# Patient Record
Sex: Male | Born: 1970 | Race: Black or African American | Hispanic: No | Marital: Married | Smoking: Former smoker
Health system: Southern US, Community
[De-identification: ages and names within clinical notes are randomized; demographics above are authoritative.]

---

## 2002-09-23 ENCOUNTER — Emergency Department (HOSPITAL_COMMUNITY): Admission: EM | Admit: 2002-09-23 | Discharge: 2002-09-23 | Payer: Self-pay | Admitting: *Deleted

## 2002-09-23 ENCOUNTER — Encounter: Payer: Self-pay | Admitting: *Deleted

## 2004-01-05 ENCOUNTER — Emergency Department (HOSPITAL_COMMUNITY): Admission: EM | Admit: 2004-01-05 | Discharge: 2004-01-05 | Payer: Self-pay | Admitting: *Deleted

## 2004-02-07 ENCOUNTER — Emergency Department (HOSPITAL_COMMUNITY): Admission: EM | Admit: 2004-02-07 | Discharge: 2004-02-08 | Payer: Self-pay | Admitting: *Deleted

## 2005-10-12 ENCOUNTER — Emergency Department (HOSPITAL_COMMUNITY): Admission: EM | Admit: 2005-10-12 | Discharge: 2005-10-12 | Payer: Self-pay | Admitting: Emergency Medicine

## 2006-09-26 ENCOUNTER — Emergency Department (HOSPITAL_COMMUNITY): Admission: EM | Admit: 2006-09-26 | Discharge: 2006-09-26 | Payer: Self-pay | Admitting: Emergency Medicine

## 2010-07-21 ENCOUNTER — Emergency Department (HOSPITAL_COMMUNITY)
Admission: EM | Admit: 2010-07-21 | Discharge: 2010-07-21 | Disposition: A | Discharge status: Home / Self Care | Payer: Self-pay | Attending: Emergency Medicine | Admitting: Emergency Medicine

## 2010-07-21 DIAGNOSIS — R220 Localized swelling, mass and lump, head: Secondary | ICD-10-CM | POA: Insufficient documentation

## 2010-07-21 DIAGNOSIS — K047 Periapical abscess without sinus: Secondary | ICD-10-CM | POA: Insufficient documentation

## 2010-07-21 DIAGNOSIS — R221 Localized swelling, mass and lump, neck: Secondary | ICD-10-CM | POA: Insufficient documentation

## 2010-07-21 DIAGNOSIS — K089 Disorder of teeth and supporting structures, unspecified: Secondary | ICD-10-CM | POA: Insufficient documentation

## 2010-11-21 ENCOUNTER — Other Ambulatory Visit (HOSPITAL_COMMUNITY): Payer: Self-pay | Admitting: Physical Medicine and Rehabilitation

## 2010-11-21 DIAGNOSIS — M5137 Other intervertebral disc degeneration, lumbosacral region: Secondary | ICD-10-CM

## 2010-11-21 DIAGNOSIS — M545 Low back pain: Secondary | ICD-10-CM

## 2010-11-21 DIAGNOSIS — M5126 Other intervertebral disc displacement, lumbar region: Secondary | ICD-10-CM

## 2010-11-23 ENCOUNTER — Ambulatory Visit (HOSPITAL_COMMUNITY)
Admission: RE | Admit: 2010-11-23 | Discharge: 2010-11-23 | Disposition: A | Discharge status: Home / Self Care | Payer: BC Managed Care – PPO | Source: Ambulatory Visit | Attending: Physical Medicine and Rehabilitation | Admitting: Physical Medicine and Rehabilitation

## 2010-11-23 DIAGNOSIS — M51379 Other intervertebral disc degeneration, lumbosacral region without mention of lumbar back pain or lower extremity pain: Secondary | ICD-10-CM | POA: Insufficient documentation

## 2010-11-23 DIAGNOSIS — M545 Low back pain, unspecified: Secondary | ICD-10-CM | POA: Insufficient documentation

## 2010-11-23 DIAGNOSIS — M5137 Other intervertebral disc degeneration, lumbosacral region: Secondary | ICD-10-CM

## 2010-11-23 DIAGNOSIS — X500XXA Overexertion from strenuous movement or load, initial encounter: Secondary | ICD-10-CM | POA: Insufficient documentation

## 2010-11-23 DIAGNOSIS — IMO0002 Reserved for concepts with insufficient information to code with codable children: Secondary | ICD-10-CM | POA: Insufficient documentation

## 2010-11-23 DIAGNOSIS — M5126 Other intervertebral disc displacement, lumbar region: Secondary | ICD-10-CM

## 2010-12-05 ENCOUNTER — Emergency Department (HOSPITAL_COMMUNITY)
Admission: EM | Admit: 2010-12-05 | Discharge: 2010-12-05 | Disposition: A | Discharge status: Home / Self Care | Payer: BC Managed Care – PPO | Attending: Emergency Medicine | Admitting: Emergency Medicine

## 2010-12-05 ENCOUNTER — Encounter: Payer: Self-pay | Admitting: *Deleted

## 2010-12-05 ENCOUNTER — Emergency Department (HOSPITAL_COMMUNITY): Payer: BC Managed Care – PPO

## 2010-12-05 DIAGNOSIS — M778 Other enthesopathies, not elsewhere classified: Secondary | ICD-10-CM

## 2010-12-05 DIAGNOSIS — M65849 Other synovitis and tenosynovitis, unspecified hand: Secondary | ICD-10-CM | POA: Insufficient documentation

## 2010-12-05 DIAGNOSIS — M65839 Other synovitis and tenosynovitis, unspecified forearm: Secondary | ICD-10-CM | POA: Insufficient documentation

## 2010-12-05 MED ORDER — DICLOFENAC SODIUM 75 MG PO TBEC: 75.0000 mg | DELAYED_RELEASE_TABLET | Freq: Two times a day (BID) | ORAL | Status: AC

## 2010-12-05 NOTE — ED Notes (Signed)
C/o to pain to left arm/wrist x 2-3 wks.  Denies injury.  Increased pain with movement of thumb.

## 2010-12-05 NOTE — ED Provider Notes (Signed)
History     CSN: [id number] Arrival date & time: 12/05/2010  1:23 PM  Chief Complaint  Patient presents with  . Arm Pain   Patient is a 40 y.o. male presenting with wrist pain. The history is provided by the patient.  Wrist Pain The current episode started 1 to 4 weeks ago. The problem occurs constantly. The problem has been gradually worsening. Associated symptoms include arthralgias. Pertinent negatives include no abdominal pain, chest pain, fever, headaches, joint swelling, myalgias, nausea, neck pain, numbness, rash, swollen glands, vomiting or weakness. The symptoms are aggravated by twisting (palpation, movement). He has tried NSAIDs for the symptoms. The treatment provided no relief.    History reviewed. No pertinent past medical history.  History reviewed. No pertinent past surgical history.  No family history on file.  History  Substance Use Topics  . Smoking status: Never Smoker   . Smokeless tobacco: Not on file  . Alcohol Use: No      Review of Systems  Constitutional: Negative for fever.  HENT: Negative for neck pain.   Cardiovascular: Negative for chest pain.  Gastrointestinal: Negative for nausea, vomiting and abdominal pain.  Musculoskeletal: Positive for arthralgias. Negative for myalgias, back pain and joint swelling.  Skin: Negative for rash.  Neurological: Negative for weakness, numbness and headaches.  All other systems reviewed and are negative.    Physical Exam  BP 135/77  Pulse 69  Temp(Src) 98 F (36.7 C) (Oral)  Resp 14  Ht 6\' 2"  (1.88 m)  Wt 195 lb (88.451 kg)  BMI 25.04 kg/m2  SpO2 100%  Physical Exam  Nursing note and vitals reviewed. Constitutional: He is oriented to person, place, and time. He appears well-developed and well-nourished. No distress.  HENT:  Head: Normocephalic and atraumatic.  Mouth/Throat: Oropharynx is clear and moist.  Neck: Normal range of motion. Neck supple. No thyromegaly present.  Cardiovascular: Normal  rate, regular rhythm and normal heart sounds.   Pulmonary/Chest: Effort normal and breath sounds normal.  Musculoskeletal: He exhibits tenderness. He exhibits no edema.       Left wrist: He exhibits decreased range of motion and tenderness. He exhibits no swelling, no effusion, no crepitus, no deformity and no laceration.  Lymphadenopathy:    He has no cervical adenopathy.  Neurological: He is alert and oriented to person, place, and time. He exhibits normal muscle tone. Coordination normal.  Skin: Skin is warm and dry.    ED Course  ORTHOPEDIC INJURY TREATMENT Date/Time: 12/05/2010 3:35 PM Performed by: Trisha Mangle, Adib Wahba L. Authorized by: Benny Lennert Consent: Verbal consent obtained. Written consent not obtained. Consent given by: patient Patient understanding: patient states understanding of the procedure being performed Patient consent: the patient's understanding of the procedure matches consent given Procedure consent: procedure consent matches procedure scheduled Imaging studies: imaging studies available Patient identity confirmed: verbally with patient Time out: Immediately prior to procedure a "time out" was called to verify the correct patient, procedure, equipment, support staff and site/side marked as required. Injury location: wrist Location details: left wrist Injury type: soft tissue Pre-procedure neurovascular assessment: neurovascularly intact Pre-procedure distal perfusion: normal Pre-procedure neurological function: normal Pre-procedure range of motion: normal Local anesthesia used: no Patient sedated: no Immobilization: brace Splint type: velcro wrist splint. Post-procedure neurovascular assessment: post-procedure neurovascularly intact Post-procedure distal perfusion: normal Post-procedure neurological function: normal Post-procedure range of motion: normal Patient tolerance: Patient tolerated the procedure well with no immediate  complications.    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  Will apply velcro wrist splint and pt agrees to f/u with ortho   Dg Wrist Complete Left  12/05/2010  *RADIOLOGY REPORT*  Clinical Data: Pain in the left thumb and posterior left wrist  LEFT WRIST - COMPLETE 3+ VIEW  Comparison: None.  Findings: Four view exam of the left wrist shows no evidence for an acute fracture.  Carpal alignment is anatomic.  No worrisome lytic or sclerotic osseous abnormality.  IMPRESSION: Normal exam.  Original Report Authenticated By: ERIC A. MANSELL, M.D.   Patient / Family / Caregiver understand and agree with initial ED impression and plan with expectations set for ED visit.    OUTPATIENT MEDICATIONS PRESCRIBED FROM THE ED:  Patient's Medications  New Prescriptions   DICLOFENAC (VOLTAREN) 75 MG EC TABLET    Take 1 tablet (75 mg total) by mouth 2 (two) times daily. Take with food  Previous Medications   No medications on file  Modified Medications   No medications on file  Discontinued Medications   IBUPROFEN (ADVIL,MOTRIN) 200 MG TABLET    Take 200 mg by mouth every 6 (six) hours as needed. For pain      The patient appears reasonably screened and/or stabilized for discharge and I doubt any other medical condition or other [facility] requiring further screening, evaluation, or treatment in the ED at this time prior to discharge.   Goran Olden L. Amr Sturte[place], [place] 12/12/10 1311

## 2010-12-11 ENCOUNTER — Ambulatory Visit: Payer: BC Managed Care – PPO | Admitting: Orthopedic Surgery

## 2010-12-11 ENCOUNTER — Encounter: Payer: Self-pay | Admitting: Orthopedic Surgery

## 2010-12-13 ENCOUNTER — Ambulatory Visit (INDEPENDENT_AMBULATORY_CARE_PROVIDER_SITE_OTHER): Payer: BC Managed Care – PPO | Admitting: Orthopedic Surgery

## 2010-12-13 ENCOUNTER — Encounter: Payer: Self-pay | Admitting: Orthopedic Surgery

## 2010-12-13 VITALS — HR 58 | Ht 74.5 in | Wt 196.0 lb

## 2010-12-13 DIAGNOSIS — M654 Radial styloid tenosynovitis [de Quervain]: Secondary | ICD-10-CM

## 2010-12-13 MED ORDER — DICLOFENAC SODIUM 75 MG PO TBEC: 75.0000 mg | DELAYED_RELEASE_TABLET | Freq: Two times a day (BID) | ORAL | Status: AC

## 2010-12-13 NOTE — Progress Notes (Signed)
Patient referred from the emergency room  The patient complains of pain in his LEFT wrist over his LEFT thumb since August of this year.  The pain started gradually and is associated with a sharp burning sent pain over the thumb associated with ulnar deviation which seems to come and go activity related.  Intensity of pain 7/10.  Review of systems reveals no other issues all 14 systems were reviewed  History reviewed. No pertinent past medical history.  History reviewed. No pertinent past surgical history.  Vital signs height is 6 feet 2-1/2 inches weight is 196 pounds pulse is 58. Gen. Appearance the patient is tall lean muscular  He is oriented x3 his mood and affect are normal  He ambulates normally  Examination of the LEFT wrist he is tender over the first extensor compartment there is swelling.  He has pain with ulnar deviation wrist joint is stable strength is normal skin is intact pulses good temperature is normal sensation is normal.  No pathologic reflexes balance was excellent  X-rays from the hospital with the report indicated no fracture dislocation or bony abnormality  ER record indicates possible de Quervain's syndrome  Diagnosis de Quervain's syndrome  Recommend anti-inflammatories I refilled his Voltaren 75 mg twice a day.  Wear brace 6 weeks ice 30 minutes a day.  Call if no improvement after 6 weeks.

## 2010-12-13 NOTE — Patient Instructions (Signed)
DEQUERVAINS SYNDROME   THUMB TENDONITIS  Wear brace and take medication for 6 weeks

## 2010-12-14 NOTE — ED Provider Notes (Signed)
Medical screening examination/treatment/procedure(s) were performed by non-physician practitioner and as supervising physician I was immediately available for consultation/collaboration.   Benny Lennert, MD 12/14/10 [phone]

## 2011-01-31 LAB — URINALYSIS, ROUTINE W REFLEX MICROSCOPIC
Bilirubin Urine: NEGATIVE
Hgb urine dipstick: NEGATIVE
Nitrite: NEGATIVE
Specific Gravity, Urine: >1.03 — ABNORMAL HIGH
Urobilinogen, UA: 0.2
pH: 6

## 2011-07-02 ENCOUNTER — Emergency Department (HOSPITAL_COMMUNITY)
Admission: EM | Admit: 2011-07-02 | Discharge: 2011-07-02 | Disposition: A | Discharge status: Home / Self Care | Payer: BC Managed Care – PPO | Attending: Emergency Medicine | Admitting: Emergency Medicine

## 2011-07-02 ENCOUNTER — Encounter (HOSPITAL_COMMUNITY): Payer: Self-pay | Admitting: *Deleted

## 2011-07-02 DIAGNOSIS — S39012A Strain of muscle, fascia and tendon of lower back, initial encounter: Secondary | ICD-10-CM

## 2011-07-02 DIAGNOSIS — S335XXA Sprain of ligaments of lumbar spine, initial encounter: Secondary | ICD-10-CM | POA: Insufficient documentation

## 2011-07-02 DIAGNOSIS — X503XXA Overexertion from repetitive movements, initial encounter: Secondary | ICD-10-CM | POA: Insufficient documentation

## 2011-07-02 MED ORDER — DICLOFENAC SODIUM 75 MG PO TBEC: 75.0000 mg | DELAYED_RELEASE_TABLET | Freq: Two times a day (BID) | ORAL | Status: AC

## 2011-07-02 MED ORDER — METHOCARBAMOL 500 MG PO TABS: ORAL_TABLET | ORAL | Status: DC

## 2011-07-02 MED ORDER — HYDROCODONE-ACETAMINOPHEN 7.5-325 MG PO TABS: 1.0000 | ORAL_TABLET | ORAL | Status: AC | PRN

## 2011-07-02 NOTE — ED Provider Notes (Signed)
History     CSN: [id number]  Arrival date & time 07/02/11  1658   First MD Initiated Contact with Patient 07/02/11 1756      Chief Complaint  Patient presents with  . Back Pain    (Consider location/radiation/quality/duration/timing/severity/associated sxs/prior treatment) Patient is a 41 y.o. male presenting with back pain. The history is provided by the patient.  Back Pain  This is a new problem. The current episode started more than 2 days ago. The problem occurs daily. The problem has not changed since onset.The pain is associated with lifting heavy objects. The pain is present in the lumbar spine. The quality of the pain is described as shooting and aching. The pain is moderate. The symptoms are aggravated by bending, twisting and certain positions. The pain is the same all the time. Pertinent negatives include no chest pain, no abdominal pain, no bowel incontinence, no perianal numbness, no bladder incontinence, no dysuria and no paresthesias. He has tried NSAIDs for the symptoms. The treatment provided no relief.    History reviewed. No pertinent past medical history.  History reviewed. No pertinent past surgical history.  Family History  Problem Relation Age of Onset  . Heart disease    . Diabetes      History  Substance Use Topics  . Smoking status: Never Smoker   . Smokeless tobacco: Not on file  . Alcohol Use: No      Review of Systems  Constitutional: Negative for activity change.       All ROS Neg except as noted in HPI  HENT: Negative for nosebleeds and neck pain.   Eyes: Negative for photophobia and discharge.  Respiratory: Negative for cough, shortness of breath and wheezing.   Cardiovascular: Negative for chest pain and palpitations.  Gastrointestinal: Negative for abdominal pain, blood in stool and bowel incontinence.  Genitourinary: Negative for bladder incontinence, dysuria, frequency and hematuria.  Musculoskeletal: Positive for back pain. Negative  for arthralgias.  Skin: Negative.   Neurological: Negative for dizziness, seizures, speech difficulty and paresthesias.  Psychiatric/Behavioral: Negative for hallucinations and confusion.    Allergies  Review of patient's allergies indicates no known allergies.  Home Medications   Current Outpatient Rx  Name Route Sig Dispense Refill  . DICLOFENAC SODIUM 75 MG PO TBEC Oral Take 1 tablet (75 mg total) by mouth 2 (two) times daily. Take with food 20 tablet 0  . DICLOFENAC SODIUM 75 MG PO TBEC Oral Take 1 tablet (75 mg total) by mouth 2 (two) times daily. 40 tablet 1  . DICLOFENAC SODIUM 75 MG PO TBEC Oral Take 1 tablet (75 mg total) by mouth 2 (two) times daily. 14 tablet 0  . HYDROCODONE-ACETAMINOPHEN 7.5-325 MG PO TABS Oral Take 1 tablet by mouth every 4 (four) hours as needed for pain. 20 tablet 0  . METHOCARBAMOL 500 MG PO TABS  2 po tid for spasm 30 tablet 0    BP 128/78  Pulse 66  Temp(Src) 97.8 F (36.6 C) (Oral)  Resp 20  Ht 6\' 2"  (1.88 m)  Wt 204 lb (92.534 kg)  BMI 26.19 kg/m2  SpO2 99%  Physical Exam  Nursing note and vitals reviewed. Constitutional: He is oriented to person, place, and time. He appears well-developed and well-nourished.  Non-toxic appearance.  HENT:  Head: Normocephalic.  Right Ear: Tympanic membrane and external ear normal.  Left Ear: Tympanic membrane and external ear normal.  Eyes: EOM and lids are normal. Pupils are equal, round, and reactive to light.  Neck: Normal range of motion. Neck supple. Carotid bruit is not present.  Cardiovascular: Normal rate, regular rhythm, normal heart sounds, intact distal pulses and normal pulses.   Pulmonary/Chest: Breath sounds normal. No respiratory distress.  Abdominal: Soft. Bowel sounds are normal. There is no tenderness. There is no guarding.  Musculoskeletal: Normal range of motion.       There is pain in palpable spasm in the lower lumbar area. There is decreased range of motion due to to pain.    Lymphadenopathy:       Head (right side): No submandibular adenopathy present.       Head (left side): No submandibular adenopathy present.    He has no cervical adenopathy.  Neurological: He is alert and oriented to person, place, and time. He has normal strength. No cranial nerve deficit or sensory deficit. He exhibits normal muscle tone. Coordination normal.  Skin: Skin is warm and dry.  Psychiatric: He has a normal mood and affect. His speech is normal.    ED Course  Procedures (including critical care time) Pulse oximetry 99% on room air. Within normal limits by my interpretation. Labs Reviewed - No data to display No results found.   1. Lumbar strain       MDM  I have reviewed nursing notes, vital signs, and all appropriate lab and imaging results for this patient. Patient states he does a lot of heavy lifting on his job and he has been having problems with his lower back over the last 3-4 days. The patient also states that he was diagnosed with a" bulging disc" approximately a year ago. This began to get better with physical therapy. No gross neurologic deficits appreciated on examination today. Patient is prescribed all care and 75 mg twice daily with food, hydrocodone 7.5 mg every 4 hours as needed, and Robaxin 3 times daily for spasm. Patient is to see the spine specialist if not improving.       Kathie Dike, [place] 07/02/11 [phone]

## 2011-07-02 NOTE — ED Notes (Signed)
Low back pain,  No known injury

## 2011-07-02 NOTE — ED Notes (Signed)
Pt states has a bulging disc in lower back; diagnosis in September 2012. Pt states has a flare up for a couple of days now. Pain does not radiate into legs.

## 2011-07-02 NOTE — Discharge Instructions (Signed)
Please alternate heat and ice to your lower back. Please use dalteparin 2 times daily with food until all taken. Please use Robaxin for spasms if needed, please use Norco for pain if needed. Robaxin Norco may cause drowsiness, please use with caution. If not improving please see the spine specialists listed above for additional evaluation and treatment.Back Pain, Adult Back pain is very common. The pain often gets better over time. The cause of back pain is usually not dangerous. Most people can learn to manage their back pain on their own.  HOME CARE   Stay active. Start with short walks on flat ground if you can. Try to walk farther each day.   Do not sit, drive, or stand in one place for more than 30 minutes. Do not stay in bed.   Do not avoid exercise or work. Activity can help your back heal faster.   Be careful when you bend or lift an object. Bend at your knees, keep the object close to you, and do not twist.   Sleep on a firm mattress. Lie on your side, and bend your knees. If you lie on your back, put a pillow under your knees.   Only take medicines as told by your doctor.   Put ice on the injured area.   Put ice in a plastic bag.   Place a towel between your skin and the bag.   Leave the ice on for 15 to 20 minutes, 3 to 4 times a day for the first 2 to 3 days. After that, you can switch between ice and heat packs.   Ask your doctor about back exercises or massage.   Avoid feeling anxious or stressed. Find good ways to deal with stress, such as exercise.  GET HELP RIGHT AWAY IF:   Your pain does not go away with rest or medicine.   Your pain does not go away in 1 week.   You have new problems.   You do not feel well.   The pain spreads into your legs.   You cannot control when you poop (bowel movement) or pee (urinate).   Your arms or legs feel weak or lose feeling (numbness).   You feel sick to your stomach (nauseous) or throw up (vomit).   You have belly  (abdominal) pain.   You feel like you may pass out (faint).  MAKE SURE YOU:   Understand these instructions.   Will watch your condition.   Will get help right away if you are not doing well or get worse.  Document Released: 09/18/2007 Document Revised: 03/21/2011 Document Reviewed: 08/20/2010 [employer] Patient Information 2012 [place], [place].

## 2011-07-05 NOTE — ED Provider Notes (Signed)
Medical screening examination/treatment/procedure(s) were performed by non-physician practitioner and as supervising physician I was immediately available for consultation/collaboration.  Donnetta Hutching, MD 07/05/11 2255

## 2013-07-19 ENCOUNTER — Emergency Department (HOSPITAL_COMMUNITY): Payer: BC Managed Care – PPO

## 2013-07-19 ENCOUNTER — Encounter (HOSPITAL_COMMUNITY): Payer: Self-pay | Admitting: Emergency Medicine

## 2013-07-19 ENCOUNTER — Emergency Department (HOSPITAL_COMMUNITY)
Admission: EM | Admit: 2013-07-19 | Discharge: 2013-07-19 | Disposition: A | Discharge status: Home / Self Care | Payer: BC Managed Care – PPO | Attending: Emergency Medicine | Admitting: Emergency Medicine

## 2013-07-19 DIAGNOSIS — S99929A Unspecified injury of unspecified foot, initial encounter: Secondary | ICD-10-CM

## 2013-07-19 DIAGNOSIS — Y929 Unspecified place or not applicable: Secondary | ICD-10-CM | POA: Insufficient documentation

## 2013-07-19 DIAGNOSIS — X500XXA Overexertion from strenuous movement or load, initial encounter: Secondary | ICD-10-CM | POA: Insufficient documentation

## 2013-07-19 DIAGNOSIS — E119 Type 2 diabetes mellitus without complications: Secondary | ICD-10-CM | POA: Insufficient documentation

## 2013-07-19 DIAGNOSIS — Y9389 Activity, other specified: Secondary | ICD-10-CM | POA: Insufficient documentation

## 2013-07-19 DIAGNOSIS — S8990XA Unspecified injury of unspecified lower leg, initial encounter: Secondary | ICD-10-CM | POA: Insufficient documentation

## 2013-07-19 DIAGNOSIS — Z8679 Personal history of other diseases of the circulatory system: Secondary | ICD-10-CM | POA: Insufficient documentation

## 2013-07-19 DIAGNOSIS — Z8739 Personal history of other diseases of the musculoskeletal system and connective tissue: Secondary | ICD-10-CM | POA: Insufficient documentation

## 2013-07-19 DIAGNOSIS — S39012A Strain of muscle, fascia and tendon of lower back, initial encounter: Secondary | ICD-10-CM

## 2013-07-19 DIAGNOSIS — S335XXA Sprain of ligaments of lumbar spine, initial encounter: Secondary | ICD-10-CM | POA: Insufficient documentation

## 2013-07-19 DIAGNOSIS — S99919A Unspecified injury of unspecified ankle, initial encounter: Secondary | ICD-10-CM

## 2013-07-19 MED ORDER — OXYCODONE-ACETAMINOPHEN 5-325 MG PO TABS: 1.0000 | ORAL_TABLET | ORAL | Status: DC | PRN

## 2013-07-19 MED ORDER — CYCLOBENZAPRINE HCL 10 MG PO TABS: 10.0000 mg | ORAL_TABLET | Freq: Three times a day (TID) | ORAL | Status: DC | PRN

## 2013-07-19 NOTE — ED Notes (Signed)
Pt arrives with c/o lower back pain, states he was helping his step son move furniture and started having pain on Saturday, pain has gotten worse until today, pt states it was hard to get out of bed this morning. No acute distress, ambulates independently with no assistance. A/O x4

## 2013-07-19 NOTE — ED Provider Notes (Signed)
CSN: [id number]     Arrival date & time 07/19/13  1428 History  This chart was scribed for non-physician practitioner Pauline Ausammy Findley Blankenbaker, PA-C working with Donnetta HutchingBrian Cook, MD by Dorothey Basemania Sutton, ED Scribe. This patient was seen in room APFT23/APFT23 and the patient's care was started at 4:50 PM.    Chief Complaint  Patient presents with  . Back Pain   The history is provided by the patient. No language interpreter was used.   HPI Comments: Sean Steele is a 43 y.o. male who presents to the Emergency Department complaining of a constant pain to the lower back onset 3 days ago after he reports that he had to do some heavy lifting. Patient reports an associated pain to the right, upper leg. He states that the pain has been progressively worsening and is exacerbated with movement. He reports taking ibuprofen at home with temporary relief. Patient reports a history of herniated disc (diagnosed by MRI in 2012) and states that his current symptoms feel similar. He denies history of prior surgeries to the area. He denies weakness, numbness, bowel or bladder incontinence/retention, fever, chills. He reports that he has an appointment with Dr. Janna ArchonDiego in 2 days. Patient has no other pertinent medical history.   History reviewed. No pertinent past medical history. History reviewed. No pertinent past surgical history. Family History  Problem Relation Age of Onset  . Heart disease    . Diabetes     History  Substance Use Topics  . Smoking status: Never Smoker   . Smokeless tobacco: Not on file  . Alcohol Use: No    Review of Systems  Constitutional: Negative for fever and chills.  Respiratory: Negative for shortness of breath.   Gastrointestinal: Negative for vomiting, abdominal pain and constipation.  Genitourinary: Negative for dysuria, hematuria, flank pain, decreased urine volume and difficulty urinating.       No perineal numbness or incontinence of urine or feces  Musculoskeletal: Positive for back  pain and myalgias. Negative for joint swelling.  Skin: Negative for rash.  Neurological: Negative for weakness and numbness.  All other systems reviewed and are negative.  Allergies  Review of patient's allergies indicates no known allergies.  Home Medications   Current Outpatient Rx  Name  Route  Sig  Dispense  Refill  . methocarbamol (ROBAXIN) 500 MG tablet      2 po tid for spasm   30 tablet   0    Triage Vitals: BP 116/75  Pulse 79  Temp(Src) 98 F (36.7 C) (Oral)  Resp 14  Ht 6\' 2"  (1.88 m)  Wt 200 lb (90.719 kg)  BMI 25.67 kg/m2  SpO2 99%  Physical Exam  Nursing note and vitals reviewed. Constitutional: He is oriented to person, place, and time. He appears well-developed and well-nourished. No distress.  HENT:  Head: Normocephalic and atraumatic.  Eyes: Conjunctivae are normal.  Neck: Normal range of motion. Neck supple.  Cardiovascular: Normal rate, regular rhythm, normal heart sounds and intact distal pulses.   No murmur heard. Pulmonary/Chest: Effort normal and breath sounds normal. No respiratory distress.  Abdominal: Soft. He exhibits no distension. There is no tenderness.  Musculoskeletal: Normal range of motion. He exhibits tenderness. He exhibits no edema.       Lumbar back: He exhibits tenderness and pain. He exhibits normal range of motion, no swelling, no deformity, no laceration and normal pulse.  Tenderness to palpation to the bilateral lumbar paraspinal muscles.   Neurological: He is alert and oriented  to person, place, and time. He has normal strength. No sensory deficit. He exhibits normal muscle tone. Coordination and gait normal.  Reflex Scores:      Patellar reflexes are 2+ on the right side and 2+ on the left side.      Achilles reflexes are 2+ on the right side and 2+ on the left side. Normal strength against resistance of bilateral lower extremities.   Skin: Skin is warm and dry. No rash noted.  Psychiatric: He has a normal mood and affect.  His behavior is normal.    ED Course  Procedures (including critical care time)  DIAGNOSTIC STUDIES: Oxygen Saturation is 99% on room air, normal by my interpretation.    COORDINATION OF CARE: 3:08 PM- Ordered an x-ray of the L spine.  4:54 PM- Discussed that x-ray results were negative. Will discharge patient with muscle relaxants and #15 percocet to manage symptoms. Patient will follow up with Dr. Janna Arch in 2 days. Discussed treatment plan with patient at bedside and patient verbalized agreement.     Labs Review Labs Reviewed - No data to display  Imaging Review Dg Lumbar Spine Complete  07/19/2013   CLINICAL DATA:  Low back pain  EXAM: LUMBAR SPINE - COMPLETE 4+ VIEW  COMPARISON:  MR L SPINE W/O dated 11/23/2010  FINDINGS: There are 5 nonrib bearing lumbar-type vertebral bodies. The vertebral body heights are maintained. The alignment is anatomic. There is no spondylolysis. There is no acute fracture or static listhesis. Mild degenerative disc disease at L5-S1.  The SI joints are unremarkable.  IMPRESSION: No acute osseous injury of the lumbar spine.   Electronically Signed   By: Elige Ko   On: 07/19/2013 15:40     EKG Interpretation None      MDM   Final diagnoses:  Lumbar strain    Patient has ttp of the lumbar paraspinal muscles.  No focal neuro deficits on exam.  Ambulates with a steady gait.  No concerning symptoms for emergent neurological or infectious process   I personally performed the services described in this documentation, which was scribed in my presence. The recorded information has been reviewed and is accurate.   Niomie Englert L. Trisha Mangle, PA-C 07/22/13 1643

## 2013-07-19 NOTE — ED Notes (Signed)
Pt with lower back pain after moving furniture and a casket on Saturday, pt able to ambulate to room without difficultly

## 2013-07-19 NOTE — Discharge Instructions (Signed)
Lumbosacral Strain Lumbosacral strain is a strain of any of the parts that make up your lumbosacral vertebrae. Your lumbosacral vertebrae are the bones that make up the lower third of your backbone. Your lumbosacral vertebrae are held together by muscles and tough, fibrous tissue (ligaments).  CAUSES  A sudden blow to your back can cause lumbosacral strain. Also, anything that causes an excessive stretch of the muscles in the low back can cause this strain. This is typically seen when people exert themselves strenuously, fall, lift heavy objects, bend, or crouch repeatedly. RISK FACTORS  Physically demanding work.  Participation in pushing or pulling sports or sports that require sudden twist of the back (tennis, golf, baseball).  Weight lifting.  Excessive lower back curvature.  Forward-tilted pelvis.  Weak back or abdominal muscles or both.  Tight hamstrings. SIGNS AND SYMPTOMS  Lumbosacral strain may cause pain in the area of your injury or pain that moves (radiates) down your leg.  DIAGNOSIS Your health care provider can often diagnose lumbosacral strain through a physical exam. In some cases, you may need tests such as X-ray exams.  TREATMENT  Treatment for your lower back injury depends on many factors that your clinician will have to evaluate. However, most treatment will include the use of anti-inflammatory medicines. HOME CARE INSTRUCTIONS   Avoid hard physical activities (tennis, racquetball, waterskiing) if you are not in proper physical condition for it. This may aggravate or create problems.  If you have a back problem, avoid sports requiring sudden body movements. Swimming and walking are generally safer activities.  Maintain good posture.  Maintain a healthy weight.  For acute conditions, you may put ice on the injured area.  Put ice in a plastic bag.  Place a towel between your skin and the bag.  Leave the ice on for 20 minutes, 2 3 times a day.  When the  low back starts healing, stretching and strengthening exercises may be recommended. SEEK MEDICAL CARE IF:  Your back pain is getting worse.  You experience severe back pain not relieved with medicines. SEEK IMMEDIATE MEDICAL CARE IF:   You have numbness, tingling, weakness, or problems with the use of your arms or legs.  There is a change in bowel or bladder control.  You have increasing pain in any area of the body, including your belly (abdomen).  You notice shortness of breath, dizziness, or feel faint.  You feel sick to your stomach (nauseous), are throwing up (vomiting), or become sweaty.  You notice discoloration of your toes or legs, or your feet get very cold. MAKE SURE YOU:   Understand these instructions.  Will watch your condition.  Will get help right away if you are not doing well or get worse. Document Released: 01/09/2005 Document Revised: 01/20/2013 Document Reviewed: 11/18/2012 [employer] Patient Information 2014 [employer].  

## 2013-07-23 NOTE — ED Provider Notes (Signed)
Medical screening examination/treatment/procedure(s) were performed by non-physician practitioner and as supervising physician I was immediately available for consultation/collaboration.   EKG Interpretation None       Donnetta HutchingBrian Mariya Mottley, MD 07/23/13 1954

## 2014-08-30 ENCOUNTER — Encounter (HOSPITAL_COMMUNITY): Payer: Self-pay | Admitting: Emergency Medicine

## 2014-08-30 ENCOUNTER — Emergency Department (HOSPITAL_COMMUNITY)
Admission: EM | Admit: 2014-08-30 | Discharge: 2014-08-30 | Disposition: A | Discharge status: Home / Self Care | Payer: Self-pay | Attending: Emergency Medicine | Admitting: Emergency Medicine

## 2014-08-30 DIAGNOSIS — K529 Noninfective gastroenteritis and colitis, unspecified: Secondary | ICD-10-CM | POA: Insufficient documentation

## 2014-08-30 DIAGNOSIS — Z79899 Other long term (current) drug therapy: Secondary | ICD-10-CM | POA: Insufficient documentation

## 2014-08-30 LAB — CBC WITH DIFFERENTIAL/PLATELET
Basophils Absolute: 0 10*3/uL (ref 0.0–0.1)
Basophils Relative: 0 % (ref 0–1)
EOS PCT: 1 % (ref 0–5)
Eosinophils Absolute: 0.1 10*3/uL (ref 0.0–0.7)
HEMATOCRIT: 44.4 % (ref 39.0–52.0)
Hemoglobin: 15.2 g/dL (ref 13.0–17.0)
LYMPHS ABS: 2.3 10*3/uL (ref 0.7–4.0)
LYMPHS PCT: 21 % (ref 12–46)
MCH: 30.4 pg (ref 26.0–34.0)
MCHC: 34.2 g/dL (ref 30.0–36.0)
MCV: 88.8 fL (ref 78.0–100.0)
Monocytes Absolute: 1.2 10*3/uL — ABNORMAL HIGH (ref 0.1–1.0)
Monocytes Relative: 11 % (ref 3–12)
Neutro Abs: 7.2 10*3/uL (ref 1.7–7.7)
Neutrophils Relative %: 67 % (ref 43–77)
PLATELETS: 251 10*3/uL (ref 150–400)
RBC: 5 MIL/uL (ref 4.22–5.81)
RDW: 12.3 % (ref 11.5–15.5)
WBC: 10.8 10*3/uL — AB (ref 4.0–10.5)

## 2014-08-30 LAB — COMPREHENSIVE METABOLIC PANEL
ALBUMIN: 4.9 g/dL (ref 3.5–5.0)
ALK PHOS: 72 U/L (ref 38–126)
ALT: 19 U/L (ref 17–63)
ANION GAP: 10 (ref 5–15)
AST: 18 U/L (ref 15–41)
BILIRUBIN TOTAL: 1.8 mg/dL — AB (ref 0.3–1.2)
BUN: 16 mg/dL (ref 6–20)
CHLORIDE: 100 mmol/L — AB (ref 101–111)
CO2: 26 mmol/L (ref 22–32)
CREATININE: 1.47 mg/dL — AB (ref 0.61–1.24)
Calcium: 9.7 mg/dL (ref 8.9–10.3)
GFR calc Af Amer: >60 mL/min (ref >60)
GFR, EST NON AFRICAN AMERICAN: 57 mL/min — AB (ref >60)
Glucose, Bld: 89 mg/dL (ref 65–99)
POTASSIUM: 3.6 mmol/L (ref 3.5–5.1)
Sodium: 136 mmol/L (ref 135–145)
Total Protein: 8.5 g/dL — ABNORMAL HIGH (ref 6.5–8.1)

## 2014-08-30 MED ORDER — ONDANSETRON 4 MG PO TBDP
ORAL_TABLET | ORAL | Status: DC
Start: 2014-08-30 — End: 2016-03-31

## 2014-08-30 MED ORDER — KETOROLAC TROMETHAMINE 30 MG/ML IJ SOLN
30.0000 mg | Freq: Once | INTRAMUSCULAR | Status: AC
  Administered 2014-08-30: 30 mg via INTRAVENOUS
  Filled 2014-08-30: qty 1

## 2014-08-30 MED ORDER — ONDANSETRON HCL 4 MG/2ML IJ SOLN
4.0000 mg | Freq: Once | INTRAMUSCULAR | Status: AC
  Administered 2014-08-30: 4 mg via INTRAVENOUS
  Filled 2014-08-30: qty 2

## 2014-08-30 MED ORDER — DICYCLOMINE HCL 20 MG PO TABS: ORAL_TABLET | ORAL | Status: DC

## 2014-08-30 MED ORDER — SODIUM CHLORIDE 0.9 % IV BOLUS (SEPSIS)
1000.0000 mL | Freq: Once | INTRAVENOUS | Status: AC
  Administered 2014-08-30: 1000 mL via INTRAVENOUS

## 2014-08-30 NOTE — ED Provider Notes (Signed)
CSN: [id number]     Arrival date & time 08/30/14  1539 History   First MD Initiated Contact with Patient 08/30/14 1557     Chief Complaint  Patient presents with  . Abdominal Pain     (Consider location/radiation/quality/duration/timing/severity/associated sxs/prior Treatment) Patient is a 44 y.o. male presenting with abdominal pain. The history is provided by the patient (pt complains of vomiting and diarrhea).  Abdominal Pain Pain location:  Generalized Pain quality: aching   Pain radiates to:  Does not radiate Pain severity:  Moderate Onset quality:  Sudden Timing:  Intermittent Progression:  Improving Chronicity:  New Context: not alcohol use   Associated symptoms: diarrhea and vomiting   Associated symptoms: no chest pain, no cough, no fatigue and no hematuria     History reviewed. No pertinent past medical history. History reviewed. No pertinent past surgical history. Family History  Problem Relation Age of Onset  . Heart disease    . Diabetes     History  Substance Use Topics  . Smoking status: Never Smoker   . Smokeless tobacco: Not on file  . Alcohol Use: Yes     Comment: occassionally    Review of Systems  Constitutional: Negative for appetite change and fatigue.  HENT: Negative for congestion, ear discharge and sinus pressure.   Eyes: Negative for discharge.  Respiratory: Negative for cough.   Cardiovascular: Negative for chest pain.  Gastrointestinal: Positive for vomiting, abdominal pain and diarrhea.  Genitourinary: Negative for frequency and hematuria.  Musculoskeletal: Negative for back pain.  Skin: Negative for rash.  Neurological: Negative for seizures and headaches.  Psychiatric/Behavioral: Negative for hallucinations.      Allergies  Review of patient's allergies indicates no known allergies.  Home Medications   Prior to Admission medications   Medication Sig Start Date End Date Taking? Authorizing Provider  cyclobenzaprine (FLEXERIL)  10 MG tablet Take 1 tablet (10 mg total) by mouth 3 (three) times daily as needed. Patient not taking: Reported on 08/30/2014 07/19/13   Tammy Triplett, PA-C  dicyclomine (BENTYL) 20 MG tablet Take one every 6 hours as needed for constipation 08/30/14   Bethann BerkshireJoseph Twanna Resh, MD  ibuprofen (ADVIL,MOTRIN) 200 MG tablet Take 200 mg by mouth every 6 (six) hours as needed (pain).     Historical Provider, MD  ondansetron (ZOFRAN ODT) 4 MG disintegrating tablet 4mg  ODT q4 hours prn nausea/vomit 08/30/14   Bethann BerkshireJoseph Lucas Exline, MD  oxyCODONE-acetaminophen (PERCOCET/ROXICET) 5-325 MG per tablet Take 1 tablet by mouth every 4 (four) hours as needed for severe pain. Patient not taking: Reported on 08/30/2014 07/19/13   Tammy Triplett, PA-C   BP 110/76 mmHg  Pulse 75  Temp(Src) 99.4 F (37.4 C) (Oral)  Resp 18  Ht 6\' 2"  (1.88 m)  Wt 200 lb (90.719 kg)  BMI 25.67 kg/m2  SpO2 99% Physical Exam  Constitutional: He is oriented to person, place, and time. He appears well-developed.  HENT:  Head: Normocephalic.  Eyes: Conjunctivae and EOM are normal. No scleral icterus.  Neck: Neck supple. No thyromegaly present.  Cardiovascular: Normal rate and regular rhythm.  Exam reveals no gallop and no friction rub.   No murmur heard. Pulmonary/Chest: No stridor. He has no wheezes. He has no rales. He exhibits no tenderness.  Abdominal: He exhibits no distension. There is tenderness. There is no rebound.  Minimal tenderness throughout  Musculoskeletal: Normal range of motion. He exhibits no edema.  Lymphadenopathy:    He has no cervical adenopathy.  Neurological: He is oriented to  person, place, and time. He exhibits normal muscle tone. Coordination normal.  Skin: No rash noted. No erythema.  Psychiatric: He has a normal mood and affect. His behavior is normal.    ED Course  Procedures (including critical care time) Labs Review Labs Reviewed  CBC WITH DIFFERENTIAL/PLATELET - Abnormal; Notable for the following:    WBC 10.8  (*)    Monocytes Absolute 1.2 (*)    All other components within normal limits  COMPREHENSIVE METABOLIC PANEL - Abnormal; Notable for the following:    Chloride 100 (*)    Creatinine, Ser 1.47 (*)    Total Protein 8.5 (*)    Total Bilirubin 1.8 (*)    GFR calc non Af Amer 57 (*)    All other components within normal limits    Imaging Review No results found.   EKG Interpretation None      MDM   Final diagnoses:  Gastroenteritis    Gastroenteritis,  rx with zofran, bentyl, fluids and follow up    Bethann BerkshireJoseph Kavian Peters, MD 08/30/14 [phone]

## 2014-08-30 NOTE — Discharge Instructions (Signed)
Drink plenty of fluids.  Take imodium for diarrhea.  Follow up in 2 days if not improving.

## 2014-08-30 NOTE — ED Notes (Signed)
PT c/o diarrhea starting 3 days ago with nausea and vomiting starting today. PT denies any urinary symptoms.

## 2014-08-30 NOTE — ED Notes (Signed)
Pt states he is feeling much better now. Family at bedside.

## 2014-09-01 ENCOUNTER — Emergency Department (HOSPITAL_COMMUNITY)
Admission: EM | Admit: 2014-09-01 | Discharge: 2014-09-01 | Disposition: A | Discharge status: Home / Self Care | Payer: Self-pay | Attending: Emergency Medicine | Admitting: Emergency Medicine

## 2014-09-01 ENCOUNTER — Emergency Department (HOSPITAL_COMMUNITY): Payer: Self-pay

## 2014-09-01 ENCOUNTER — Encounter (HOSPITAL_COMMUNITY): Payer: Self-pay | Admitting: Intensive Care

## 2014-09-01 DIAGNOSIS — Z79899 Other long term (current) drug therapy: Secondary | ICD-10-CM | POA: Insufficient documentation

## 2014-09-01 DIAGNOSIS — R509 Fever, unspecified: Secondary | ICD-10-CM | POA: Insufficient documentation

## 2014-09-01 DIAGNOSIS — R630 Anorexia: Secondary | ICD-10-CM | POA: Insufficient documentation

## 2014-09-01 LAB — CBC WITH DIFFERENTIAL/PLATELET
BASOS ABS: 0 10*3/uL (ref 0.0–0.1)
BASOS PCT: 0 % (ref 0–1)
EOS ABS: 0.1 10*3/uL (ref 0.0–0.7)
EOS PCT: 0 % (ref 0–5)
HCT: 39.5 % (ref 39.0–52.0)
HEMOGLOBIN: 13.4 g/dL (ref 13.0–17.0)
Lymphocytes Relative: 6 % — ABNORMAL LOW (ref 12–46)
Lymphs Abs: 0.7 10*3/uL (ref 0.7–4.0)
MCH: 30.2 pg (ref 26.0–34.0)
MCHC: 33.9 g/dL (ref 30.0–36.0)
MCV: 89.2 fL (ref 78.0–100.0)
MONO ABS: 0.8 10*3/uL (ref 0.1–1.0)
MONOS PCT: 8 % (ref 3–12)
Neutro Abs: 9.6 10*3/uL — ABNORMAL HIGH (ref 1.7–7.7)
Neutrophils Relative %: 86 % — ABNORMAL HIGH (ref 43–77)
Platelets: 175 10*3/uL (ref 150–400)
RBC: 4.43 MIL/uL (ref 4.22–5.81)
RDW: 12.1 % (ref 11.5–15.5)
WBC: 11.2 10*3/uL — ABNORMAL HIGH (ref 4.0–10.5)

## 2014-09-01 LAB — URINALYSIS, ROUTINE W REFLEX MICROSCOPIC
Bilirubin Urine: NEGATIVE
GLUCOSE, UA: NEGATIVE mg/dL
KETONES UR: NEGATIVE mg/dL
LEUKOCYTES UA: NEGATIVE
NITRITE: NEGATIVE
PH: 5.5 (ref 5.0–8.0)
PROTEIN: NEGATIVE mg/dL
Specific Gravity, Urine: 1.025 (ref 1.005–1.030)
Urobilinogen, UA: 0.2 mg/dL (ref 0.0–1.0)

## 2014-09-01 LAB — COMPREHENSIVE METABOLIC PANEL
ALT: 15 U/L — AB (ref 17–63)
ANION GAP: 8 (ref 5–15)
AST: 20 U/L (ref 15–41)
Albumin: 4 g/dL (ref 3.5–5.0)
Alkaline Phosphatase: 65 U/L (ref 38–126)
BUN: 13 mg/dL (ref 6–20)
CALCIUM: 8.8 mg/dL — AB (ref 8.9–10.3)
CO2: 27 mmol/L (ref 22–32)
CREATININE: 1.16 mg/dL (ref 0.61–1.24)
Chloride: 103 mmol/L (ref 101–111)
GFR calc non Af Amer: >60 mL/min (ref >60)
GLUCOSE: 110 mg/dL — AB (ref 65–99)
Potassium: 3.7 mmol/L (ref 3.5–5.1)
Sodium: 138 mmol/L (ref 135–145)
TOTAL PROTEIN: 7.3 g/dL (ref 6.5–8.1)
Total Bilirubin: 1.3 mg/dL — ABNORMAL HIGH (ref 0.3–1.2)

## 2014-09-01 LAB — URINE MICROSCOPIC-ADD ON

## 2014-09-01 LAB — SEDIMENTATION RATE: SED RATE: 12 mm/h (ref 0–16)

## 2014-09-01 LAB — CK: Total CK: 105 U/L (ref 49–397)

## 2014-09-01 MED ORDER — SODIUM CHLORIDE 0.9 % IV BOLUS (SEPSIS)
1000.0000 mL | Freq: Once | INTRAVENOUS | Status: AC
  Administered 2014-09-01: 1000 mL via INTRAVENOUS

## 2014-09-01 MED ORDER — SODIUM CHLORIDE 0.9 % IV SOLN
Freq: Once | INTRAVENOUS | Status: AC
  Administered 2014-09-01: 09:00:00 via INTRAVENOUS

## 2014-09-01 MED ORDER — IBUPROFEN 800 MG PO TABS: 800.0000 mg | ORAL_TABLET | Freq: Three times a day (TID) | ORAL | Status: DC

## 2014-09-01 MED ORDER — ACETAMINOPHEN 325 MG PO TABS: 650.0000 mg | ORAL_TABLET | Freq: Once | ORAL | Status: DC

## 2014-09-01 MED ORDER — DOXYCYCLINE HYCLATE 100 MG PO CAPS: 100.0000 mg | ORAL_CAPSULE | Freq: Two times a day (BID) | ORAL | Status: DC

## 2014-09-01 MED ORDER — ONDANSETRON HCL 4 MG/2ML IJ SOLN
4.0000 mg | Freq: Once | INTRAMUSCULAR | Status: AC
  Administered 2014-09-01: 4 mg via INTRAVENOUS
  Filled 2014-09-01: qty 2

## 2014-09-01 MED ORDER — ACETAMINOPHEN 500 MG PO TABS
1000.0000 mg | ORAL_TABLET | Freq: Once | ORAL | Status: AC
  Administered 2014-09-01: 1000 mg via ORAL

## 2014-09-01 MED ORDER — KETOROLAC TROMETHAMINE 30 MG/ML IJ SOLN
30.0000 mg | Freq: Once | INTRAMUSCULAR | Status: AC
  Administered 2014-09-01: 30 mg via INTRAVENOUS
  Filled 2014-09-01: qty 1

## 2014-09-01 MED ORDER — ACETAMINOPHEN 500 MG PO TABS
ORAL_TABLET | ORAL | Status: AC
  Administered 2014-09-01: 1000 mg via ORAL
  Filled 2014-09-01: qty 2

## 2014-09-01 NOTE — Discharge Instructions (Signed)
Although A fever is nonspecific, it can be a symptom of Lyme's disease. You're being treated with 14 days of antibiotic that is used to treat Lyme's disease. Do not take the antibiotic(Doxycycline) or ibuprofen on an empty stomach. Return to emergency room with any worsening symptoms.  Fever, Adult A fever is a temperature of 100.4 F (38 C) or above.  HOME CARE  Take fever medicine as told by your doctor. Do not  take aspirin for fever if you are younger than 42 years of age.  If you are given antibiotic medicine, take it as told. Finish the medicine even if you start to feel better.  Rest.  Drink enough fluids to keep your pee (urine) clear or pale yellow. Do not drink alcohol.  Take a bath or shower with room temperature water. Do not use ice water or alcohol sponge baths.  Wear lightweight, loose clothes. GET HELP RIGHT AWAY IF:   You are short of breath or have trouble breathing.  You are very weak.  You are dizzy or you pass out (faint).  You are very thirsty or are making little or no urine.  You have new pain.  You throw up (vomit) or have watery poop (diarrhea).  You keep throwing up or having watery poop for more than 1 to 2 days.  You have a stiff neck or light bothers your eyes.  You have a skin rash.  You have a fever or problems (symptoms) that last for more than 2 to 3 days.  You have a fever and your problems quickly get worse.  You keep throwing up the fluids you drink.  You do not feel better after 3 days.  You have new problems. MAKE SURE YOU:   Understand these instructions.  Will watch your condition.  Will get help right away if you are not doing well or get worse. Document Released: 01/09/2008 Document Revised: 06/24/2011 Document Reviewed: 01/31/2011 [facility] Patient Information 2015 [place], [place]. This information is not intended to replace advice given to you by your health care provider. Make sure you discuss any questions you  have with your health care provider.

## 2014-09-01 NOTE — ED Notes (Signed)
Pt was bitten by a tick two weeks ago. Pt has had body aches, headache, numbness in hands, muscle spasms, and a fever. Pt is currently running fever of 103.1

## 2014-09-01 NOTE — ED Provider Notes (Addendum)
CSN: [id number]     Arrival date & time 09/01/14  0715 History   First MD Initiated Contact with Patient 09/01/14 [phone]     Chief Complaint  Patient presents with  . Generalized Body Aches      HPI  Patient presents for evaluation with a febrile illness this morning. Seen 2 days ago with GI complaints including nausea vomiting and diarrhea. Reassuring studies, he was discharged home. He is done well. He ate and drank well yesterday no nausea vomiting no diarrhea.  He awakened at 4:30 this morning with fever shakes and chills. States that he was handling her blanket and his wife states he was "shaking out of the bed".  Arrives here with temp 103. He claims a headache. States he had some pain in his neck and back earlier. Denies feeling stiff in his neck. States his back was sore and his wife massaged it. . Bodyaches. No localizing joint pain. No vision changes. Does not feel numb or weak in his arms or legs now, individually, or diffusely. Poor appetite this morning but no nausea vomiting or diarrhea. No chest pain or cough. No shortness of breath. No skin rash.  He has to tick bites from about 10 days ago. He states that one was taken off of his back near his right shoulder and was taken off his left leg. He did not develop diffuse rash. Wife states that he was red around the lower left leg. She does not offer independently that developed a target-like lesion. However, when asked about it she states that the redness did extend away and she "thinks so" when I ask her if it may have cleared somewhat or appeared to be a target. He did not develop diffuse rash. He has no palmar rash to the hands or feet.  History reviewed. No pertinent past medical history. History reviewed. No pertinent past surgical history. Family History  Problem Relation Age of Onset  . Heart disease    . Diabetes     History  Substance Use Topics  . Smoking status: Never Smoker   . Smokeless tobacco: Not on file  .  Alcohol Use: Yes     Comment: occassionally    Review of Systems  Constitutional: Positive for fever and appetite change. Negative for chills, diaphoresis and fatigue.  HENT: Negative for mouth sores, sore throat and trouble swallowing.   Eyes: Negative for visual disturbance.  Respiratory: Negative for cough, chest tightness, shortness of breath and wheezing.   Cardiovascular: Negative for chest pain.  Gastrointestinal: Negative for nausea, vomiting, abdominal pain, diarrhea and abdominal distention.  Endocrine: Negative for polydipsia, polyphagia and polyuria.  Genitourinary: Negative for dysuria, frequency and hematuria.  Musculoskeletal: Positive for myalgias. Negative for gait problem.  Skin: Negative for color change, pallor and rash.  Neurological: Negative for dizziness, syncope, light-headedness and headaches.  Hematological: Does not bruise/bleed easily.  Psychiatric/Behavioral: Negative for behavioral problems and confusion.      Allergies  Review of patient's allergies indicates no known allergies.  Home Medications   Prior to Admission medications   Medication Sig Start Date End Date Taking? Authorizing Provider  cyclobenzaprine (FLEXERIL) 10 MG tablet Take 1 tablet (10 mg total) by mouth 3 (three) times daily as needed. Patient not taking: Reported on 08/30/2014 07/19/13   Tammy Triplett, PA-C  dicyclomine (BENTYL) 20 MG tablet Take one every 6 hours as needed for constipation Patient not taking: Reported on 09/01/2014 08/30/14   Bethann BerkshireJoseph Zammit, MD  doxycycline (VIBRAMYCIN)  100 MG capsule Take 1 capsule (100 mg total) by mouth 2 (two) times daily. 09/01/14   Rolland PorterMark Dhillon [place], MD  ibuprofen (ADVIL,MOTRIN) 800 MG tablet Take 1 tablet (800 mg total) by mouth 3 (three) times daily. 09/01/14   Rolland PorterMark Virl Coble, MD  ondansetron (ZOFRAN ODT) 4 MG disintegrating tablet 4mg  ODT q4 hours prn nausea/vomit Patient not taking: Reported on 09/01/2014 08/30/14   Bethann BerkshireJoseph Zammit, MD   oxyCODONE-acetaminophen (PERCOCET/ROXICET) 5-325 MG per tablet Take 1 tablet by mouth every 4 (four) hours as needed for severe pain. Patient not taking: Reported on 08/30/2014 07/19/13   Tammy Triplett, PA-C   BP 108/69 mmHg  Pulse 90  Temp(Src) 103.2 F (39.6 C) (Oral)  Ht 6\' 2"  (1.88 m)  Wt 195 lb (88.451 kg)  BMI 25.03 kg/m2  SpO2 99% Physical Exam  Constitutional: He is oriented to person, place, and time. He appears well-developed and well-nourished. No distress.  HENT:  Head: Normocephalic.  Eyes: Conjunctivae are normal. Pupils are equal, round, and reactive to light. No scleral icterus.  Neck: Normal range of motion. Neck supple. No thyromegaly present.  Cardiovascular: Normal rate and regular rhythm.  Exam reveals no gallop and no friction rub.   No murmur heard. Pulmonary/Chest: Effort normal and breath sounds normal. No respiratory distress. He has no wheezes. He has no rales.  Abdominal: Soft. Bowel sounds are normal. He exhibits no distension. There is no tenderness. There is no rebound.  Musculoskeletal: Normal range of motion.  Neurological: He is alert and oriented to person, place, and time.  Skin: Skin is warm and dry. No rash noted.  Psychiatric: He has a normal mood and affect. His behavior is normal.    ED Course  Procedures (including critical care time) Labs Review Labs Reviewed  CBC WITH DIFFERENTIAL/PLATELET - Abnormal; Notable for the following:    WBC 11.2 (*)    Neutrophils Relative % 86 (*)    Neutro Abs 9.6 (*)    Lymphocytes Relative 6 (*)    All other components within normal limits  COMPREHENSIVE METABOLIC PANEL - Abnormal; Notable for the following:    Glucose, Bld 110 (*)    Calcium 8.8 (*)    ALT 15 (*)    Total Bilirubin 1.3 (*)    All other components within normal limits  URINALYSIS, ROUTINE W REFLEX MICROSCOPIC - Abnormal; Notable for the following:    Hgb urine dipstick SMALL (*)    All other components within normal limits  URINE  MICROSCOPIC-ADD ON - Abnormal; Notable for the following:    Squamous Epithelial / LPF FEW (*)    All other components within normal limits  SEDIMENTATION RATE  CK  B. BURGDORFI ANTIBODIES    Imaging Review Dg Chest 2 View  09/01/2014   CLINICAL DATA:  Fever. Chills. Body Aches. Onset of symptoms at 430 this morning.  EXAM: CHEST  2 VIEW  COMPARISON:  None.  FINDINGS: Cardiopericardial silhouette within normal limits. Mediastinal contours normal. Trachea midline. No airspace disease or effusion.  IMPRESSION: No active cardiopulmonary disease.   Electronically Signed   By: Andreas NewportGeoffrey  Lamke M.D.   On: 09/01/2014 08:16     EKG Interpretation None      MDM   Final diagnoses:  Fever    No current rash to suggest chronium migrans. However, with wife's description, I will treat him with doxycycline. He feels much improved after fluids and anti-inflammatories and Tylenol. Studies are otherwise unrevealing. Plan is discharge home. Return precautions discussed.  Rolland Porter, MD 09/01/14 1131  Rolland Porter, MD 10/30/14 0830

## 2014-09-02 LAB — B. BURGDORFI ANTIBODIES: B burgdorferi Ab IgG+IgM: <0.91 {ISR} (ref 0.00–0.90)

## 2015-01-24 ENCOUNTER — Ambulatory Visit (INDEPENDENT_AMBULATORY_CARE_PROVIDER_SITE_OTHER): Payer: Worker's Compensation | Admitting: Emergency Medicine

## 2015-01-24 ENCOUNTER — Ambulatory Visit: Payer: Worker's Compensation

## 2015-01-24 VITALS — BP 124/80 | HR 74 | Temp 98.5°F | Resp 16 | Ht 74.0 in | Wt 203.4 lb

## 2015-01-24 DIAGNOSIS — M79642 Pain in left hand: Secondary | ICD-10-CM

## 2015-01-24 DIAGNOSIS — S6710XA Crushing injury of unspecified finger(s), initial encounter: Secondary | ICD-10-CM

## 2015-01-24 MED ORDER — HYDROCODONE-ACETAMINOPHEN 5-325 MG PO TABS: 1.0000 | ORAL_TABLET | ORAL | Status: DC | PRN

## 2015-01-24 MED ORDER — CEPHALEXIN 500 MG PO CAPS: 500.0000 mg | ORAL_CAPSULE | Freq: Four times a day (QID) | ORAL | Status: DC

## 2015-01-24 NOTE — Progress Notes (Signed)
Subjective:  Patient ID: Sean Steele, male    DOB: December 05, 1970  Age: 44 y.o. 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 No loss consciousness or neurologic or visual symptoms  History  Past medical social and family history negative  Review of Systems  Constitutional: Negative for fever, chills and appetite change.  HENT: Negative for congestion, ear pain, postnasal drip, sinus pressure and sore throat.   Eyes: Negative for pain and redness.  Respiratory: Negative for cough, shortness of breath and wheezing.   Cardiovascular: Negative for leg swelling.  Gastrointestinal: Negative for nausea, vomiting, abdominal pain, diarrhea, constipation and blood in stool.  Endocrine: Negative for polyuria.  Genitourinary: Negative for dysuria, urgency, frequency and flank pain.  Musculoskeletal: Negative for gait problem.  Skin: Negative for rash.  Neurological: Negative for weakness and headaches.  Psychiatric/Behavioral: Negative for confusion and decreased concentration. The patient is not nervous/anxious.     Objective:  BP 124/80 mmHg  Pulse 74  Temp(Src) 98.5 F (36.9 C) (Oral)  Resp 16  Ht  (1.88 m)  Wt 203 lb 6.4 oz (92.262 kg)  BMI 26.10 kg/m2  SpO2 98%  Physical Exam  Constitutional: He is oriented to person, place, and time. He appears well-developed and well-nourished. No distress.  HENT:  Head: Normocephalic and atraumatic.  Right Ear: External ear normal.  Left Ear: External ear normal.  Nose: Nose normal.  Eyes: Conjunctivae and EOM are normal. Pupils are equal, round, and reactive to light. No scleral icterus.  Neck: Normal range of motion. Neck supple. No tracheal deviation present.  Cardiovascular: Normal rate, regular rhythm and normal heart sounds.     Pulmonary/Chest: Effort normal. No respiratory distress. He has no wheezes. He has no rales.  Abdominal: He exhibits no mass. There is no tenderness. There is no rebound and no guarding.  Musculoskeletal: He exhibits no edema.       Left hand: He exhibits tenderness and swelling. He exhibits no deformity and no laceration.  He has swelling and ecchymosis tuft of the second finger. There is no deformity there is no subungual hematoma.  Lymphadenopathy:    He has no cervical adenopathy.  Neurological: He is alert and oriented to person, place, and time. Coordination normal.  Skin: Skin is warm and dry. No rash noted.  Psychiatric: He has a normal mood and affect. His behavior is normal.      Assessment & Plan:   Jhoel was seen today for finger injury.  Diagnoses and all orders for this visit:  Crushed finger, distal, initial encounter -     DG Finger Index Left; Future -     Tdap vaccine greater than or equal to 7yo IM  Other orders -     cephALEXin (KEFLEX) 500 MG capsule; Take 1 capsule (500 mg total) by mouth 4 (four) times daily. -     HYDROcodone-acetaminophen (NORCO) 5-325 MG tablet; Take 1-2 tablets by mouth every 4 (four) hours as needed.   I am having Mr. Legrand start on cephALEXin and HYDROcodone-acetaminophen. I am also having him maintain his oxyCODONE-acetaminophen, cyclobenzaprine, ondansetron, dicyclomine, ibuprofen, and doxycycline.  Meds ordered this encounter  Medications  . cephALEXin (KEFLEX) 500 MG capsule    Sig: Take 1 capsule (500 mg  total) by mouth 4 (four) times daily.    Dispense:  40 capsule    Refill:  0  . HYDROcodone-acetaminophen (NORCO) 5-325 MG tablet    Sig: Take 1-2 tablets by mouth every 4 (four) hours as needed.    Dispense:  20 tablet    Refill:  0    Appropriate red flag conditions were discussed with the patient as well as actions that should be taken.  Patient expressed his understanding.  Follow-up: Return in 1 week (on  01/31/2015).  Carmelina Dane, MD   [facility] reading (PRIMARY) by  Dr. Dareen Piano.  negative.

## 2015-01-24 NOTE — Patient Instructions (Signed)
Crush Injury, Fingers or Toes °A crush injury to the fingers or toes means the tissues have been damaged by being squeezed (compressed). There will be bleeding into the tissues and swelling. Often, blood will collect under the skin. When this happens, the skin on the finger often dies and may slough off (shed) 1 week to 10 days later. Usually, new skin is growing underneath. If the injury has been too severe and the tissue does not survive, the damaged tissue may begin to turn black over several days.  °Wounds which occur because of the crushing may be stitched (sutured) shut. However, crush injuries are more likely to become infected than other injuries. These wounds may not be closed as tightly as other types of cuts to prevent infection. Nails involved are often lost. These usually grow back over several weeks.  °DIAGNOSIS °X-rays may be taken to see if there is any injury to the bones. °TREATMENT °Broken bones (fractures) may be treated with splinting, depending on the fracture. Often, no treatment is required for fractures of the last bone in the fingers or toes. °HOME CARE INSTRUCTIONS  °· The crushed part should be raised (elevated) above the heart or center of the chest as much as possible for the first several days or as directed. This helps with pain and lessens swelling. Less swelling increases the chances that the crushed part will survive. °· Put ice on the injured area. °¨ Put ice in a plastic bag. °¨ Place a towel between your skin and the bag. °¨ Leave the ice on for 15-20 minutes, 03-04 times a day for the first 2 days. °· Only take over-the-counter or prescription medicines for pain, discomfort, or fever as directed by your caregiver. °· Use your injured part only as directed. °· Change your bandages (dressings) as directed. °· Keep all follow-up appointments as directed by your caregiver. Not keeping your appointment could result in a chronic or permanent injury, pain, and disability. If there is  any problem keeping the appointment, you must call to reschedule. °SEEK IMMEDIATE MEDICAL CARE IF:  °· There is redness, swelling, or increasing pain in the wound area. °· Pus is coming from the wound. °· You have a fever. °· You notice a bad smell coming from the wound or dressing. °· The edges of the wound do not stay together after the sutures have been removed. °· You are unable to move the injured finger or toe. °MAKE SURE YOU:  °· Understand these instructions. °· Will watch your condition. °· Will get help right away if you are not doing well or get worse. °  °This information is not intended to replace advice given to you by your health care provider. Make sure you discuss any questions you have with your health care provider. °  °Document Released: 04/01/2005 Document Revised: 06/24/2011 Document Reviewed: 08/17/2010 °Elsevier Interactive Patient Education ©2016 [employer] ° °

## 2015-01-31 ENCOUNTER — Ambulatory Visit (INDEPENDENT_AMBULATORY_CARE_PROVIDER_SITE_OTHER): Payer: Worker's Compensation | Admitting: Emergency Medicine

## 2015-01-31 VITALS — BP 110/78 | HR 78 | Temp 98.5°F | Resp 18 | Ht 74.0 in | Wt 201.0 lb

## 2015-01-31 DIAGNOSIS — S6710XD Crushing injury of unspecified finger(s), subsequent encounter: Secondary | ICD-10-CM

## 2015-01-31 NOTE — Progress Notes (Addendum)
   Subjective:  Patient ID: Sean Steele, male    DOB: 1970-11-19  Age: 44 y.o. MRN: [id number]  CC: Follow-up and Hand Injury   HPI Sean Steele presents  with an injury to his left index finger. He suffered a crush injury of the fingertip week ago. He been working and doing well has no limitation of function has noticed he has moderate swelling with no deformity or drainage.  History   Review of Systems  Constitutional: Negative for fever, chills and appetite change.  HENT: Negative for congestion, ear pain, postnasal drip, sinus pressure and sore throat.   Eyes: Negative for pain and redness.  Respiratory: Negative for cough, shortness of breath and wheezing.   Cardiovascular: Negative for leg swelling.  Gastrointestinal: Negative for nausea, vomiting, abdominal pain, diarrhea, constipation and blood in stool.  Endocrine: Negative for polyuria.  Genitourinary: Negative for dysuria, urgency, frequency and flank pain.  Musculoskeletal: Negative for gait problem.  Skin: Negative for rash.  Neurological: Negative for weakness and headaches.  Psychiatric/Behavioral: Negative for confusion and decreased concentration. The patient is not nervous/anxious.     Objective:  BP 110/78 mmHg  Pulse 78  Temp(Src) 98.5 F (36.9 C) (Oral)  Resp 18  Ht 6\' 2"  (1.88 m)  Wt 201 lb (91.173 kg)  BMI 25.80 kg/m2  SpO2 98%  Physical Exam  Constitutional: He is oriented to person, place, and time. He appears well-developed and well-nourished.  HENT:  Head: Normocephalic and atraumatic.  Eyes: Conjunctivae are normal. Pupils are equal, round, and reactive to light.  Pulmonary/Chest: Effort normal.  Musculoskeletal: He exhibits no edema.       Left hand: He exhibits tenderness and swelling.  Neurological: He is alert and oriented to person, place, and time.  Skin: Skin is dry.  Psychiatric: He has a normal mood and affect. His behavior is normal. Thought content normal.       Assessment & Plan:   Alinda Moneyony was seen today for follow-up and hand injury.  Diagnoses and all orders for this visit:  Crushed finger, distal, initial encounter   I am having Mr. Ephriam KnucklesChristian maintain his oxyCODONE-acetaminophen, cyclobenzaprine, ondansetron, dicyclomine, ibuprofen, doxycycline, cephALEXin, and HYDROcodone-acetaminophen.  No orders of the defined types were placed in this encounter.    Appropriate red flag conditions were discussed with the patient as well as actions that should be taken.  Patient expressed his understanding.  Follow-up: Return if symptoms worsen or fail to improve.  Carmelina DaneAnderson, Tarence Searcy S, MD

## 2015-01-31 NOTE — Patient Instructions (Signed)
Crush Injury, Fingers or Toes °A crush injury to the fingers or toes means the tissues have been damaged by being squeezed (compressed). There will be bleeding into the tissues and swelling. Often, blood will collect under the skin. When this happens, the skin on the finger often dies and may slough off (shed) 1 week to 10 days later. Usually, new skin is growing underneath. If the injury has been too severe and the tissue does not survive, the damaged tissue may begin to turn black over several days.  °Wounds which occur because of the crushing may be stitched (sutured) shut. However, crush injuries are more likely to become infected than other injuries. These wounds may not be closed as tightly as other types of cuts to prevent infection. Nails involved are often lost. These usually grow back over several weeks.  °DIAGNOSIS °X-rays may be taken to see if there is any injury to the bones. °TREATMENT °Broken bones (fractures) may be treated with splinting, depending on the fracture. Often, no treatment is required for fractures of the last bone in the fingers or toes. °HOME CARE INSTRUCTIONS  °· The crushed part should be raised (elevated) above the heart or center of the chest as much as possible for the first several days or as directed. This helps with pain and lessens swelling. Less swelling increases the chances that the crushed part will survive. °· Put ice on the injured area. °¨ Put ice in a plastic bag. °¨ Place a towel between your skin and the bag. °¨ Leave the ice on for 15-20 minutes, 03-04 times a day for the first 2 days. °· Only take over-the-counter or prescription medicines for pain, discomfort, or fever as directed by your caregiver. °· Use your injured part only as directed. °· Change your bandages (dressings) as directed. °· Keep all follow-up appointments as directed by your caregiver. Not keeping your appointment could result in a chronic or permanent injury, pain, and disability. If there is  any problem keeping the appointment, you must call to reschedule. °SEEK IMMEDIATE MEDICAL CARE IF:  °· There is redness, swelling, or increasing pain in the wound area. °· Pus is coming from the wound. °· You have a fever. °· You notice a bad smell coming from the wound or dressing. °· The edges of the wound do not stay together after the sutures have been removed. °· You are unable to move the injured finger or toe. °MAKE SURE YOU:  °· Understand these instructions. °· Will watch your condition. °· Will get help right away if you are not doing well or get worse. °  °This information is not intended to replace advice given to you by your health care provider. Make sure you discuss any questions you have with your health care provider. °  °Document Released: 04/01/2005 Document Revised: 06/24/2011 Document Reviewed: 08/17/2010 °Elsevier Interactive Patient Education ©2016 [employer] ° °

## 2016-03-31 ENCOUNTER — Emergency Department (HOSPITAL_COMMUNITY)
Admission: EM | Admit: 2016-03-31 | Discharge: 2016-04-01 | Disposition: A | Discharge status: Home / Self Care | Payer: Self-pay | Attending: Emergency Medicine | Admitting: Emergency Medicine

## 2016-03-31 ENCOUNTER — Emergency Department (HOSPITAL_COMMUNITY): Payer: Self-pay

## 2016-03-31 ENCOUNTER — Encounter (HOSPITAL_COMMUNITY): Payer: Self-pay | Admitting: Emergency Medicine

## 2016-03-31 DIAGNOSIS — F17210 Nicotine dependence, cigarettes, uncomplicated: Secondary | ICD-10-CM | POA: Insufficient documentation

## 2016-03-31 DIAGNOSIS — R1084 Generalized abdominal pain: Secondary | ICD-10-CM | POA: Insufficient documentation

## 2016-03-31 DIAGNOSIS — R197 Diarrhea, unspecified: Secondary | ICD-10-CM | POA: Insufficient documentation

## 2016-03-31 DIAGNOSIS — R059 Cough, unspecified: Secondary | ICD-10-CM

## 2016-03-31 DIAGNOSIS — R05 Cough: Secondary | ICD-10-CM | POA: Insufficient documentation

## 2016-03-31 LAB — CBC WITH DIFFERENTIAL/PLATELET
BASOS ABS: 0 10*3/uL (ref 0.0–0.1)
BASOS PCT: 0 %
EOS ABS: 0.1 10*3/uL (ref 0.0–0.7)
EOS PCT: 1 %
HCT: 41.9 % (ref 39.0–52.0)
Hemoglobin: 14.1 g/dL (ref 13.0–17.0)
Lymphocytes Relative: 19 %
Lymphs Abs: 1.6 10*3/uL (ref 0.7–4.0)
MCH: 29.9 pg (ref 26.0–34.0)
MCHC: 33.7 g/dL (ref 30.0–36.0)
MCV: 88.8 fL (ref 78.0–100.0)
MONO ABS: 1 10*3/uL (ref 0.1–1.0)
MONOS PCT: 12 %
NEUTROS ABS: 5.9 10*3/uL (ref 1.7–7.7)
Neutrophils Relative %: 68 %
PLATELETS: 239 10*3/uL (ref 150–400)
RBC: 4.72 MIL/uL (ref 4.22–5.81)
RDW: 11.9 % (ref 11.5–15.5)
WBC: 8.7 10*3/uL (ref 4.0–10.5)

## 2016-03-31 LAB — URINALYSIS, ROUTINE W REFLEX MICROSCOPIC
BILIRUBIN URINE: NEGATIVE
GLUCOSE, UA: NEGATIVE mg/dL
KETONES UR: NEGATIVE mg/dL
LEUKOCYTES UA: NEGATIVE
NITRITE: NEGATIVE
PH: 5 (ref 5.0–8.0)
PROTEIN: NEGATIVE mg/dL
Specific Gravity, Urine: 1.018 (ref 1.005–1.030)

## 2016-03-31 LAB — COMPREHENSIVE METABOLIC PANEL
ALBUMIN: 4.3 g/dL (ref 3.5–5.0)
ALT: 28 U/L (ref 17–63)
ANION GAP: 7 (ref 5–15)
AST: 27 U/L (ref 15–41)
Alkaline Phosphatase: 75 U/L (ref 38–126)
BUN: 11 mg/dL (ref 6–20)
CALCIUM: 9.2 mg/dL (ref 8.9–10.3)
CO2: 27 mmol/L (ref 22–32)
Chloride: 103 mmol/L (ref 101–111)
Creatinine, Ser: 1.26 mg/dL — ABNORMAL HIGH (ref 0.61–1.24)
GFR calc non Af Amer: >60 mL/min (ref >60)
GLUCOSE: 108 mg/dL — AB (ref 65–99)
POTASSIUM: 3.7 mmol/L (ref 3.5–5.1)
SODIUM: 137 mmol/L (ref 135–145)
TOTAL PROTEIN: 7.9 g/dL (ref 6.5–8.1)
Total Bilirubin: 0.6 mg/dL (ref 0.3–1.2)

## 2016-03-31 LAB — LIPASE, BLOOD: Lipase: 25 U/L (ref 11–51)

## 2016-03-31 MED ORDER — ACETAMINOPHEN 325 MG PO TABS
650.0000 mg | ORAL_TABLET | Freq: Once | ORAL | Status: AC
  Administered 2016-03-31: 650 mg via ORAL
  Filled 2016-03-31: qty 2

## 2016-03-31 MED ORDER — IOPAMIDOL (ISOVUE-300) INJECTION 61%
100.0000 mL | Freq: Once | INTRAVENOUS | Status: AC | PRN
  Administered 2016-03-31: 100 mL via INTRAVENOUS

## 2016-03-31 MED ORDER — SODIUM CHLORIDE 0.9 % IV BOLUS (SEPSIS)
1000.0000 mL | Freq: Once | INTRAVENOUS | Status: AC
  Administered 2016-03-31: 1000 mL via INTRAVENOUS

## 2016-03-31 MED ORDER — ONDANSETRON HCL 4 MG/2ML IJ SOLN: 4.0000 mg | INTRAMUSCULAR | Status: DC | PRN

## 2016-03-31 MED ORDER — DICYCLOMINE HCL 10 MG/ML IM SOLN
20.0000 mg | Freq: Once | INTRAMUSCULAR | Status: AC
  Administered 2016-03-31: 20 mg via INTRAMUSCULAR
  Filled 2016-03-31: qty 2

## 2016-03-31 NOTE — ED Triage Notes (Signed)
Pt c/o generalized body aches and diarrhea that started today.

## 2016-03-31 NOTE — ED Provider Notes (Signed)
[facility]-EMERGENCY DEPT Provider Note   CSN: [id number] Arrival date & time: 03/31/16  2101     History   Chief Complaint Chief Complaint  Patient presents with  . Diarrhea    HPI Sean Steele is a 45 y.o. male.   Diarrhea      Pt was seen at 2125.  Per pt, c/o gradual onset and persistence of constant generalized abd "pain" since this morning.  Has been associated with multiple intermittent episodes of diarrhea and generalized body aches.  Describes the abd pain as "feels sick." Pt's family states pt has been "coughing for the past week."  Denies vomiting, no back pain, no rash, no CP/SOB, no black or blood in stools.      History reviewed. No pertinent past medical history.  Patient Active Problem List   Diagnosis Date Noted  . De Quervain's syndrome (tenosynovitis) 12/13/2010    History reviewed. No pertinent surgical history.     Home Medications    Prior to Admission medications   Medication Sig Start Date End Date Taking? Authorizing Provider  cephALEXin (KEFLEX) 500 MG capsule Take 1 capsule (500 mg total) by mouth 4 (four) times daily. Patient not taking: Reported on 01/31/2015 01/24/15   Carmelina DaneJeffery S Anderson, MD  cyclobenzaprine (FLEXERIL) 10 MG tablet Take 1 tablet (10 mg total) by mouth 3 (three) times daily as needed. Patient not taking: Reported on 08/30/2014 07/19/13   Tammy Triplett, PA-C  dicyclomine (BENTYL) 20 MG tablet Take one every 6 hours as needed for constipation Patient not taking: Reported on 09/01/2014 08/30/14   Bethann BerkshireJoseph Zammit, MD  doxycycline (VIBRAMYCIN) 100 MG capsule Take 1 capsule (100 mg total) by mouth 2 (two) times daily. Patient not taking: Reported on 01/24/2015 09/01/14   Rolland PorterMark James, MD  HYDROcodone-acetaminophen [facility](NORCO) 5-325 MG tablet Take 1-2 tablets by mouth every 4 (four) hours as needed. 01/24/15   Carmelina DaneJeffery S Anderson, MD  ibuprofen (ADVIL,MOTRIN) 800 MG tablet Take 1 tablet (800 mg total) by mouth 3 (three) times daily. Patient  not taking: Reported on 01/24/2015 09/01/14   Rolland PorterMark James, MD  ondansetron (ZOFRAN ODT) 4 MG disintegrating tablet 4mg  ODT q4 hours prn nausea/vomit Patient not taking: Reported on 09/01/2014 08/30/14   Bethann BerkshireJoseph Zammit, MD  oxyCODONE-acetaminophen (PERCOCET/ROXICET) 5-325 MG per tablet Take 1 tablet by mouth every 4 (four) hours as needed for severe pain. Patient not taking: Reported on 08/30/2014 07/19/13   Pauline Ausammy Triplett, PA-C    Family History Family History  Problem Relation Age of Onset  . Heart disease    . Diabetes      Social History Social History  Substance Use Topics  . Smoking status: Current Some Day Smoker    Types: Cigarettes  . Smokeless tobacco: Never Used  . Alcohol use No     Allergies   Patient has no known allergies.   Review of Systems Review of Systems  Gastrointestinal: Positive for diarrhea.  ROS: Statement: All systems negative except as marked or noted in the HPI; Constitutional: +generalized body aches. ; ; Eyes: Negative for eye pain, redness and discharge. ; ; ENMT: Negative for ear pain, hoarseness, nasal congestion, sinus pressure and sore throat. ; ; Cardiovascular: Negative for chest pain, palpitations, diaphoresis, dyspnea and peripheral edema. ; ; Respiratory: +cough. Negative for wheezing and stridor. ; ; Gastrointestinal: +diarrhea, abd pain. Negative for nausea, vomiting, blood in stool, hematemesis, jaundice and rectal bleeding. . ; ; Genitourinary: Negative for dysuria, flank pain and hematuria. ; ; Musculoskeletal:  Negative for back pain and neck pain. Negative for swelling and trauma.; ; Skin: Negative for pruritus, rash, abrasions, blisters, bruising and skin lesion.; ; Neuro: Negative for headache, lightheadedness and neck stiffness. Negative for weakness, altered level of consciousness, altered mental status, extremity weakness, paresthesias, involuntary movement, seizure and syncope.        Physical Exam Updated Vital Signs BP 140/76 (BP  Location: Left Arm)   Pulse 100   Temp 100.9 F (38.3 C) (Oral)   Resp 18   Ht 6\' 2"  (1.88 m)   Wt 195 lb (88.5 kg)   SpO2 98%   BMI 25.04 kg/m   Physical Exam 2130: Physical examination:  Nursing notes reviewed; Vital signs and O2 SAT reviewed; +febrile.;; Constitutional: Well developed, Well nourished, Well hydrated, In no acute distress; Head:  Normocephalic, atraumatic; Eyes: EOMI, PERRL, No scleral icterus; ENMT: Mouth and pharynx normal, Mucous membranes moist; Neck: Supple, Full range of motion, No lymphadenopathy; Cardiovascular: Regular rate and rhythm, No gallop; Respiratory: Breath sounds clear & equal bilaterally, No wheezes.  Speaking full sentences with ease, Normal respiratory effort/excursion; Chest: Nontender, Movement normal; Abdomen: Soft, +mild diffuse tenderness to palp. No rebound or guarding. Nondistended, Normal bowel sounds; Genitourinary: No CVA tenderness; Extremities: Pulses normal, No tenderness, No edema, No calf edema or asymmetry.; Neuro: AA&Ox3, Major CN grossly intact.  Speech clear. No gross focal motor or sensory deficits in extremities.; Skin: Color normal, Warm, Dry.   ED Treatments / Results  Labs (all labs ordered are listed, but only abnormal results are displayed)   EKG  EKG Interpretation None       Radiology   Procedures Procedures (including critical care time)  Medications Ordered in ED Medications  sodium chloride 0.9 % bolus 1,000 mL (not administered)  ondansetron (ZOFRAN) injection 4 mg (not administered)  dicyclomine (BENTYL) injection 20 mg (not administered)     Initial Impression / Assessment and Plan / ED Course  I have reviewed the triage vital signs and the nursing notes.  Pertinent labs & imaging results that were available during my care of the patient were reviewed by me and considered in my medical decision making (see chart for details).  MDM Reviewed: previous chart, nursing note and vitals Reviewed  previous: labs Interpretation: labs   Results for orders placed or performed during the hospital encounter of 03/31/16  Lipase, blood  Result Value Ref Range   Lipase 25 11 - 51 U/L  Comprehensive metabolic panel  Result Value Ref Range   Sodium 137 135 - 145 mmol/L   Potassium 3.7 3.5 - 5.1 mmol/L   Chloride 103 101 - 111 mmol/L   CO2 27 22 - 32 mmol/L   Glucose, Bld 108 (H) 65 - 99 mg/dL   BUN 11 6 - 20 mg/dL   Creatinine, Ser 2.95 (H) 0.61 - 1.24 mg/dL   Calcium 9.2 8.9 - 28.4 mg/dL   Total Protein 7.9 6.5 - 8.1 g/dL   Albumin 4.3 3.5 - 5.0 g/dL   AST 27 15 - 41 U/L   ALT 28 17 - 63 U/L   Alkaline Phosphatase 75 38 - 126 U/L   Total Bilirubin 0.6 0.3 - 1.2 mg/dL   GFR calc non Af Amer >60 >60 mL/min   GFR calc Af Amer >60 >60 mL/min   Anion gap 7 5 - 15  Urinalysis, Routine w reflex microscopic  Result Value Ref Range   Color, Urine YELLOW YELLOW   APPearance CLEAR CLEAR   Specific  Gravity, Urine 1.018 1.005 - 1.030   pH 5.0 5.0 - 8.0   Glucose, UA NEGATIVE NEGATIVE mg/dL   Hgb urine dipstick MODERATE (A) NEGATIVE   Bilirubin Urine NEGATIVE NEGATIVE   Ketones, ur NEGATIVE NEGATIVE mg/dL   Protein, ur NEGATIVE NEGATIVE mg/dL   Nitrite NEGATIVE NEGATIVE   Leukocytes, UA NEGATIVE NEGATIVE   RBC / HPF 0-5 0 - 5 RBC/hpf   WBC, UA 0-5 0 - 5 WBC/hpf   Bacteria, UA RARE (A) NONE SEEN   Mucous PRESENT   CBC with Differential  Result Value Ref Range   WBC 8.7 4.0 - 10.5 K/uL   RBC 4.72 4.22 - 5.81 MIL/uL   Hemoglobin 14.1 13.0 - 17.0 g/dL   HCT 16.141.9 09.639.0 - 04.552.0 %   MCV 88.8 78.0 - 100.0 fL   MCH 29.9 26.0 - 34.0 pg   MCHC 33.7 30.0 - 36.0 g/dL   RDW 40.911.9 81.111.5 - 91.415.5 %   Platelets 239 150 - 400 K/uL   Neutrophils Relative % 68 %   Neutro Abs 5.9 1.7 - 7.7 K/uL   Lymphocytes Relative 19 %   Lymphs Abs 1.6 0.7 - 4.0 K/uL   Monocytes Relative 12 %   Monocytes Absolute 1.0 0.1 - 1.0 K/uL   Eosinophils Relative 1 %   Eosinophils Absolute 0.1 0.0 - 0.7 K/uL    Basophils Relative 0 %   Basophils Absolute 0.0 0.0 - 0.1 K/uL  Lactic acid, plasma  Result Value Ref Range   Lactic Acid, Venous 0.7 0.5 - 1.9 mmol/L    0020:  Labs reassuring. Has tol PO well while in the ED without N/V. CXR and CT A/P pending. Sign out to Dr. Lynelle DoctorKnapp.     Final Clinical Impressions(s) / ED Diagnoses   Final diagnoses:  None    New Prescriptions New Prescriptions   No medications on file      Samuel JesterKathleen Venisha Boehning, DO 04/01/16 0023

## 2016-04-01 ENCOUNTER — Emergency Department (HOSPITAL_COMMUNITY): Payer: Self-pay

## 2016-04-01 ENCOUNTER — Encounter (HOSPITAL_COMMUNITY): Payer: Self-pay | Admitting: Emergency Medicine

## 2016-04-01 ENCOUNTER — Emergency Department (HOSPITAL_COMMUNITY)
Admission: EM | Admit: 2016-04-01 | Discharge: 2016-04-02 | Disposition: A | Discharge status: Home / Self Care | Payer: Self-pay | Attending: Emergency Medicine | Admitting: Emergency Medicine

## 2016-04-01 DIAGNOSIS — R21 Rash and other nonspecific skin eruption: Secondary | ICD-10-CM | POA: Insufficient documentation

## 2016-04-01 DIAGNOSIS — Z87891 Personal history of nicotine dependence: Secondary | ICD-10-CM | POA: Insufficient documentation

## 2016-04-01 DIAGNOSIS — R509 Fever, unspecified: Secondary | ICD-10-CM | POA: Insufficient documentation

## 2016-04-01 LAB — CBC WITH DIFFERENTIAL/PLATELET
BASOS ABS: 0 10*3/uL (ref 0.0–0.1)
Basophils Relative: 0 %
Eosinophils Absolute: 0 10*3/uL (ref 0.0–0.7)
Eosinophils Relative: 0 %
HEMATOCRIT: 42.2 % (ref 39.0–52.0)
HEMOGLOBIN: 14.3 g/dL (ref 13.0–17.0)
LYMPHS ABS: 2 10*3/uL (ref 0.7–4.0)
Lymphocytes Relative: 18 %
MCH: 30.4 pg (ref 26.0–34.0)
MCHC: 33.9 g/dL (ref 30.0–36.0)
MCV: 89.6 fL (ref 78.0–100.0)
Monocytes Absolute: 0.7 10*3/uL (ref 0.1–1.0)
Monocytes Relative: 6 %
NEUTROS ABS: 8.6 10*3/uL — AB (ref 1.7–7.7)
NEUTROS PCT: 76 %
Platelets: 185 10*3/uL (ref 150–400)
RBC: 4.71 MIL/uL (ref 4.22–5.81)
RDW: 12.2 % (ref 11.5–15.5)
WBC: 11.3 10*3/uL — AB (ref 4.0–10.5)

## 2016-04-01 LAB — LACTIC ACID, PLASMA: Lactic Acid, Venous: 0.7 mmol/L (ref 0.5–1.9)

## 2016-04-01 MED ORDER — SODIUM CHLORIDE 0.9 % IV BOLUS (SEPSIS)
500.0000 mL | Freq: Once | INTRAVENOUS | Status: AC
  Administered 2016-04-01: 500 mL via INTRAVENOUS

## 2016-04-01 MED ORDER — METOCLOPRAMIDE HCL 5 MG/ML IJ SOLN
10.0000 mg | Freq: Once | INTRAMUSCULAR | Status: AC
  Administered 2016-04-01: 10 mg via INTRAVENOUS
  Filled 2016-04-01: qty 2

## 2016-04-01 MED ORDER — SODIUM CHLORIDE 0.9 % IV SOLN
1000.0000 mL | INTRAVENOUS | Status: DC
  Administered 2016-04-01: 1000 mL via INTRAVENOUS

## 2016-04-01 MED ORDER — DIPHENHYDRAMINE HCL 50 MG/ML IJ SOLN
25.0000 mg | Freq: Once | INTRAMUSCULAR | Status: AC
  Administered 2016-04-01: 25 mg via INTRAVENOUS
  Filled 2016-04-01: qty 1

## 2016-04-01 MED ORDER — SODIUM CHLORIDE 0.9 % IV BOLUS (SEPSIS)
1000.0000 mL | Freq: Once | INTRAVENOUS | Status: AC
  Administered 2016-04-01: 1000 mL via INTRAVENOUS

## 2016-04-01 NOTE — ED Triage Notes (Signed)
Pt C/O 103 fever that started last night. Pt seen last night in the ER. Denies any N/V/D.

## 2016-04-01 NOTE — Discharge Instructions (Signed)
Drink plenty of fluids. You can use pepto bismol or imodium OTC for diarrhea. Avoid milk and milk products or the diarrhea may continue.  Recheck if you get worse again, such as high fever, worsening cough, worsening abdominal pain shortness of breath.

## 2016-04-01 NOTE — ED Provider Notes (Signed)
[facility]-EM[facility]Y DEPT Provider Note   CSN: [id number] Arrival date & time: 04/01/16  2103 By signing my name below, I, Bridgette HabermannMaria Tan, attest that this documentation has been prepared under the direction and in the presence of Devoria AlbeIva Jordyan Hardiman, MD. Electronically Signed: Bridgette HabermannMaria Tan, ED Scribe. 04/01/16. 11:21 PM.  Time seen 23:15 PM  History   Chief Complaint Chief Complaint  Patient presents with  . Fever   HPI Comments: Sean Steele is a 45 y.o. male with no pertinent PMHx, who presents to the Emergency Department complaining of fever (Tmax 103) onset tonight around 5 :30 PM with associated headache, diffuse joint aches and myalgias, chills, and nausea. His headache was orginialy in the right frontal now is diffuse. He denies photophobia or phonophobia. Pt also has a painful, pruritic rash to the right side of his penis that he  noticed on Dec 16. Pt states he was working at a [employer] [place] 2 days ago and noted he saw spiders and is concerned they may have bit him. No known tick exposure. No other rashes.  Pt took Tylenol around 7:45 pm today with minimal relief. Pt was seen in the ED yesterday for abdominal pain and diarrhea which he notes seems to be resolved at this time. Pt denies joint swelling, diarrhea, abdominal pain, sore throat, or any other associated symptoms. He describes a mild cough.   PCP: Isabella StallingNDIEGO,RICHARD M, MD   The history is provided by the patient and a parent. No language interpreter was used.    History reviewed. No pertinent past medical history.  Patient Active Problem List   Diagnosis Date Noted  . De Quervain's syndrome (tenosynovitis) 12/13/2010    History reviewed. No pertinent surgical history.     Home Medications    Prior to Admission medications   Medication Sig Start Date End Date Taking? Authorizing Provider  acetaminophen (TYLENOL) 500 MG tablet Take 500 mg by mouth every 6 (six) hours as needed for mild pain or moderate pain.   Yes Historical  Provider, MD  doxycycline (VIBRAMYCIN) 100 MG capsule Take 1 capsule (100 mg total) by mouth 2 (two) times daily. 04/02/16   Devoria AlbeIva Jove Beyl, MD    Family History Family History  Problem Relation Age of Onset  . Heart disease    . Diabetes      Social History Social History  Substance Use Topics  . Smoking status: Former Smoker    Types: Cigarettes  . Smokeless tobacco: Never Used  . Alcohol use No  smokes THC   Allergies   Patient has no known allergies.   Review of Systems Review of Systems  Constitutional: Positive for chills and fever.  HENT: Negative for sore throat.   Gastrointestinal: Positive for nausea. Negative for abdominal pain and diarrhea.  Musculoskeletal: Negative for joint swelling.  Skin: Positive for rash.  Neurological: Positive for headaches.  All other systems reviewed and are negative.    Physical Exam Updated Vital Signs BP 124/75 (BP Location: Left Arm)   Pulse 98   Temp 100 F (37.8 C) (Oral)   Resp 20   Ht 6\' 2"  (1.88 m)   Wt 195 lb (88.5 kg)   SpO2 100%   BMI 25.04 kg/m   Vital signs normal except low grade temp   Physical Exam  Constitutional: He is oriented to person, place, and time. He appears well-developed and well-nourished.  Non-toxic appearance. He does not appear ill. No distress.  HENT:  Head: Normocephalic and atraumatic.  Right Ear:  External ear normal.  Left Ear: External ear normal.  Nose: Nose normal. No mucosal edema or rhinorrhea.  Mouth/Throat: Oropharynx is clear and moist. No dental abscesses or uvula swelling.  nontender sinuses to palpation  Eyes: Conjunctivae and EOM are normal. Pupils are equal, round, and reactive to light.  Neck: Normal range of motion and full passive range of motion without pain. Neck supple.  Free ROM of neck without discomfort.  Cardiovascular: Normal rate, regular rhythm and normal heart sounds.  Exam reveals no gallop and no friction rub.   No murmur heard. Pulmonary/Chest:  Effort normal and breath sounds normal. No respiratory distress. He has no wheezes. He has no rales. He exhibits no tenderness.  Abdominal: Soft. Bowel sounds are normal. He exhibits no distension and no mass. There is no tenderness. There is no guarding.  Genitourinary:  Genitourinary Comments: Normal external genitalia. Few excoriation areas on the right side of the shaft of his penis.  Musculoskeletal: Normal range of motion. He exhibits no edema or tenderness.  Moves all extremities well.  Neurological: He is alert and oriented to person, place, and time. No cranial nerve deficit.  Skin: Skin is warm and dry. Rash noted. No erythema. No pallor.  Feels hot and clammy.  Psychiatric: He has a normal mood and affect. His behavior is normal. His mood appears not anxious.  Nursing note and vitals reviewed.    ED Treatments / Results  DIAGNOSTIC STUDIES: Oxygen Saturation is 100% on RA, normal by my interpretation.      Labs (all labs ordered are listed, but only abnormal results are displayed) Results for orders placed or performed during the hospital encounter of 04/01/16  Blood Culture (routine x 2)  Result Value Ref Range   Specimen Description BLOOD LEFT ANTECUBITAL    Special Requests BOTTLES DRAWN AEROBIC AND ANAEROBIC 8CC EACH    Culture PENDING    Report Status PENDING   Blood Culture (routine x 2)  Result Value Ref Range   Specimen Description BLOOD LEFT FOREARM    Special Requests BOTTLES DRAWN AEROBIC AND ANAEROBIC 12CC EACH    Culture PENDING    Report Status PENDING   Comprehensive metabolic panel  Result Value Ref Range   Sodium 133 (L) 135 - 145 mmol/L   Potassium 3.9 3.5 - 5.1 mmol/L   Chloride 99 (L) 101 - 111 mmol/L   CO2 27 22 - 32 mmol/L   Glucose, Bld 107 (H) 65 - 99 mg/dL   BUN 11 6 - 20 mg/dL   Creatinine, Ser 1.30 0.61 - 1.24 mg/dL   Calcium 9.3 8.9 - 86.5 mg/dL   Total Protein 7.9 6.5 - 8.1 g/dL   Albumin 4.2 3.5 - 5.0 g/dL   AST 28 15 - 41 U/L    ALT 29 17 - 63 U/L   Alkaline Phosphatase 70 38 - 126 U/L   Total Bilirubin 0.8 0.3 - 1.2 mg/dL   GFR calc non Af Amer >60 >60 mL/min   GFR calc Af Amer >60 >60 mL/min   Anion gap 7 5 - 15  CBC WITH DIFFERENTIAL  Result Value Ref Range   WBC 11.3 (H) 4.0 - 10.5 K/uL   RBC 4.71 4.22 - 5.81 MIL/uL   Hemoglobin 14.3 13.0 - 17.0 g/dL   HCT 78.4 69.6 - 29.5 %   MCV 89.6 78.0 - 100.0 fL   MCH 30.4 26.0 - 34.0 pg   MCHC 33.9 30.0 - 36.0 g/dL   RDW 28.4 13.2 -  15.5 %   Platelets 185 150 - 400 K/uL   Neutrophils Relative % 76 %   Neutro Abs 8.6 (H) 1.7 - 7.7 K/uL   Lymphocytes Relative 18 %   Lymphs Abs 2.0 0.7 - 4.0 K/uL   Monocytes Relative 6 %   Monocytes Absolute 0.7 0.1 - 1.0 K/uL   Eosinophils Relative 0 %   Eosinophils Absolute 0.0 0.0 - 0.7 K/uL   Basophils Relative 0 %   Basophils Absolute 0.0 0.0 - 0.1 K/uL  Urinalysis, Routine w reflex microscopic  Result Value Ref Range   Color, Urine YELLOW YELLOW   APPearance CLEAR CLEAR   Specific Gravity, Urine 1.019 1.005 - 1.030   pH 7.0 5.0 - 8.0   Glucose, UA NEGATIVE NEGATIVE mg/dL   Hgb urine dipstick NEGATIVE NEGATIVE   Bilirubin Urine NEGATIVE NEGATIVE   Ketones, ur NEGATIVE NEGATIVE mg/dL   Protein, ur 30 (A) NEGATIVE mg/dL   Nitrite NEGATIVE NEGATIVE   Leukocytes, UA NEGATIVE NEGATIVE   RBC / HPF 6-30 0 - 5 RBC/hpf   WBC, UA 0-5 0 - 5 WBC/hpf   Bacteria, UA NONE SEEN NONE SEEN   Mucous PRESENT   Lactic acid, plasma  Result Value Ref Range   Lactic Acid, Venous 0.7 0.5 - 1.9 mmol/L   Laboratory interpretation all normal except new leukocytosis, new mild hyponatremia and low chloride consistent with dehydration    EKG  EKG Interpretation  Date/Time:  Monday April 01 2016 23:50:27 EST Ventricular Rate:  79 PR Interval:    QRS Duration: 89 QT Interval:  391 QTC Calculation: 449 R Axis:   32 Text Interpretation:  Sinus rhythm LAE, consider biatrial enlargement Borderline T wave abnormalities No old tracing  to compare Confirmed by Skyylar Kopf  MD-I, Winford Hehn ([id number]) on 04/02/2016 12:04:52 AM       Radiology  Dg Chest Port 1 View  Result Date: 04/01/2016 CLINICAL DATA:  45 year old male with fever chills and chest pain. EXAM: PORTABLE CHEST 1 VIEW COMPARISON:  Chest radiograph dated 03/31/2016 FINDINGS: The heart size and mediastinal contours are within normal limits. Both lungs are clear. The visualized skeletal structures are unremarkable. IMPRESSION: No active disease. Electronically Signed   By: Elgie Collard M.D.   On: 04/01/2016 23:58     Dg Chest 2 View  Result Date: 04/01/2016 CLINICAL DATA:  Cough and diarrhea EXAM: CHEST  2 VIEW COMPARISON:  Chest radiograph 09/01/2014 FINDINGS: Cardiomediastinal contours are normal. No pneumothorax or pleural effusion. No focal airspace consolidation or pulmonary edema. IMPRESSION: Clear lungs. Electronically Signed   By: Deatra Robinson M.D.   On: 04/01/2016 00:16   Ct Abdomen Pelvis W Contrast  Result Date: 04/01/2016 CLINICAL DATA:  45 year old male with abdominal pain and diarrhea. EXAM: CT ABDOMEN AND PELVIS WITH CONTRAST TECHNIQUE: Multidetector CT imaging of the abdomen and pelvis was performed using the standard protocol following bolus administration of intravenous contrast. CONTRAST:  ISOVUE-300 IOPAMIDOL (ISOVUE-300) INJECTION 61% COMPARISON:  None. FINDINGS: Lower chest: The visualized lung bases are clear. No intra-abdominal free air or free fluid. Hepatobiliary: The liver is unremarkable. The gallbladder is contracted. Pancreas: Unremarkable. No pancreatic ductal dilatation or surrounding inflammatory changes. Spleen: Normal in size without focal abnormality. Adrenals/Urinary Tract: Adrenal glands are unremarkable. Kidneys are normal, without renal calculi, focal lesion, or hydronephrosis. Bladder is unremarkable. Stomach/Bowel: Stomach is within normal limits. Appendix appears normal. No evidence of bowel wall thickening, distention, or  inflammatory changes. Vascular/Lymphatic: No significant vascular findings are present. No enlarged  abdominal or pelvic lymph nodes. Reproductive: The prostate and seminal vesicles are grossly unremarkable. Other: Small fat containing umbilical hernia. Musculoskeletal: No acute or significant osseous findings. IMPRESSION: No acute intra-abdominopelvic pathology. No evidence of bowel obstruction or active inflammation. Normal appendix. Electronically Signed   By: Elgie Collard M.D.   On: 04/01/2016 00:29    Procedures Procedures (including critical care time)  Medications Ordered in ED Medications  0.9 %  sodium chloride infusion (1,000 mLs Intravenous New Bag/Given 04/01/16 2357)  sodium chloride 0.9 % bolus 1,000 mL (0 mLs Intravenous Stopped 04/02/16 0110)  sodium chloride 0.9 % bolus 500 mL (0 mLs Intravenous Stopped 04/02/16 0110)  metoCLOPramide (REGLAN) injection 10 mg (10 mg Intravenous Given 04/01/16 2358)  diphenhydrAMINE (BENADRYL) injection 25 mg (25 mg Intravenous Given 04/01/16 2358)  doxycycline (VIBRA-TABS) tablet 100 mg (100 mg Oral Given 04/02/16 0114)     Initial Impression / Assessment and Plan / ED Course  I have reviewed the triage vital signs and the nursing notes.  Pertinent labs & imaging results that were available during my care of the patient were reviewed by me and considered in my medical decision making (see chart for details).  Clinical Course    COORDINATION OF CARE: 11:20 PM Discussed treatment plan with pt at bedside which includes IV fluids and pt agreed to plan.Patient was given IV fluids, IV nausea medicine which is also part of migraine cocktail, laboratory testing was ordered.  12:46 AM: Discussed with pt test  results. Also discussed his possible signs of dehydration. CXR unremarkable. Pt states his headache seems to be resolved at this time. Told pt he will be placed on Doxycycline to treat tick exposure since he was an area where there were a  lot of spiders as precaution.  Final Clinical Impressions(s) / ED Diagnoses   Final diagnoses:  Fever, unspecified fever cause    New Prescriptions New Prescriptions   DOXYCYCLINE (VIBRAMYCIN) 100 MG CAPSULE    Take 1 capsule (100 mg total) by mouth 2 (two) times daily.    Plan discharge  Devoria Albe, MD, FACEP  I personally performed the services described in this documentation, which was scribed in my presence. The recorded information has been reviewed and considered.  Devoria Albe, MD, Concha Pyo, MD 04/02/16 0130

## 2016-04-01 NOTE — ED Provider Notes (Signed)
Patient left at change of shift to get results of his x-ray of the chest and abdominal/pelvis CT scan. Patient states he's feeling much better. He states he's had a cough for a week but it is improving. He denies any nausea or vomiting.  Patient was given the results of his x-rays. We also discussed his discharge instructions.  Dg Chest 2 View  Result Date: 04/01/2016 CLINICAL DATA:  Cough and diarrhea EXAM: CHEST  2 VIEW COMPARISON:  Chest radiograph 09/01/2014 FINDINGS: Cardiomediastinal contours are normal. No pneumothorax or pleural effusion. No focal airspace consolidation or pulmonary edema. IMPRESSION: Clear lungs. Electronically Signed   By: Deatra RobinsonKevin  Herman M.D.   On: 04/01/2016 00:16   Ct Abdomen Pelvis W Contrast  Result Date: 04/01/2016 CLINICAL DATA:  45 year old male with abdominal pain and diarrhea. EXAM: CT ABDOMEN AND PELVIS WITH CONTRAST TECHNIQUE: Multidetector CT imaging of the abdomen and pelvis was performed using the standard protocol following bolus administration of intravenous contrast. CONTRAST:  100mL ISOVUE-300 IOPAMIDOL (ISOVUE-300) INJECTION 61% COMPARISON:  None. FINDINGS: Lower chest: The visualized lung bases are clear. No intra-abdominal free air or free fluid. Hepatobiliary: The liver is unremarkable. The gallbladder is contracted. Pancreas: Unremarkable. No pancreatic ductal dilatation or surrounding inflammatory changes. Spleen: Normal in size without focal abnormality. Adrenals/Urinary Tract: Adrenal glands are unremarkable. Kidneys are normal, without renal calculi, focal lesion, or hydronephrosis. Bladder is unremarkable. Stomach/Bowel: Stomach is within normal limits. Appendix appears normal. No evidence of bowel wall thickening, distention, or inflammatory changes. Vascular/Lymphatic: No significant vascular findings are present. No enlarged abdominal or pelvic lymph nodes. Reproductive: The prostate and seminal vesicles are grossly unremarkable. Other: Small fat  containing umbilical hernia. Musculoskeletal: No acute or significant osseous findings. IMPRESSION: No acute intra-abdominopelvic pathology. No evidence of bowel obstruction or active inflammation. Normal appendix. Electronically Signed   By: Elgie CollardArash  Radparvar M.D.   On: 04/01/2016 00:29   Diagnoses that have been ruled out:  None  Diagnoses that are still under consideration:  None  Final diagnoses:  Diarrhea, unspecified type  Cough    Plan discharge  Devoria AlbeIva Yesika Rispoli, MD, Concha PyoFACEP     Bard Haupert, MD 04/01/16 [phone]

## 2016-04-02 ENCOUNTER — Encounter (HOSPITAL_COMMUNITY): Payer: Self-pay

## 2016-04-02 ENCOUNTER — Emergency Department (HOSPITAL_COMMUNITY)
Admission: EM | Admit: 2016-04-02 | Discharge: 2016-04-02 | Disposition: A | Discharge status: Home / Self Care | Payer: Self-pay | Attending: Emergency Medicine | Admitting: Emergency Medicine

## 2016-04-02 DIAGNOSIS — Z87891 Personal history of nicotine dependence: Secondary | ICD-10-CM | POA: Insufficient documentation

## 2016-04-02 DIAGNOSIS — Z79899 Other long term (current) drug therapy: Secondary | ICD-10-CM | POA: Insufficient documentation

## 2016-04-02 DIAGNOSIS — R7881 Bacteremia: Secondary | ICD-10-CM | POA: Insufficient documentation

## 2016-04-02 LAB — BLOOD CULTURE ID PANEL (REFLEXED)
Acinetobacter baumannii: NOT DETECTED
CANDIDA GLABRATA: NOT DETECTED
CANDIDA TROPICALIS: NOT DETECTED
Candida albicans: NOT DETECTED
Candida krusei: NOT DETECTED
Candida parapsilosis: NOT DETECTED
ENTEROBACTER CLOACAE COMPLEX: NOT DETECTED
Enterobacteriaceae species: NOT DETECTED
Enterococcus species: NOT DETECTED
Escherichia coli: NOT DETECTED
Haemophilus influenzae: NOT DETECTED
KLEBSIELLA PNEUMONIAE: NOT DETECTED
Klebsiella oxytoca: NOT DETECTED
Listeria monocytogenes: NOT DETECTED
Methicillin resistance: DETECTED — AB
Neisseria meningitidis: NOT DETECTED
PROTEUS SPECIES: NOT DETECTED
Pseudomonas aeruginosa: NOT DETECTED
SERRATIA MARCESCENS: NOT DETECTED
STAPHYLOCOCCUS SPECIES: DETECTED — AB
STREPTOCOCCUS SPECIES: DETECTED — AB
Staphylococcus aureus (BCID): NOT DETECTED
Streptococcus agalactiae: NOT DETECTED
Streptococcus pneumoniae: NOT DETECTED
Streptococcus pyogenes: NOT DETECTED

## 2016-04-02 LAB — URINALYSIS, ROUTINE W REFLEX MICROSCOPIC
BILIRUBIN URINE: NEGATIVE
Bacteria, UA: NONE SEEN
GLUCOSE, UA: NEGATIVE mg/dL
HGB URINE DIPSTICK: NEGATIVE
Ketones, ur: NEGATIVE mg/dL
Leukocytes, UA: NEGATIVE
NITRITE: NEGATIVE
PH: 7 (ref 5.0–8.0)
Protein, ur: 30 mg/dL — AB
SPECIFIC GRAVITY, URINE: 1.019 (ref 1.005–1.030)

## 2016-04-02 LAB — COMPREHENSIVE METABOLIC PANEL
ALBUMIN: 4.2 g/dL (ref 3.5–5.0)
ALK PHOS: 70 U/L (ref 38–126)
ALT: 29 U/L (ref 17–63)
ANION GAP: 7 (ref 5–15)
AST: 28 U/L (ref 15–41)
BILIRUBIN TOTAL: 0.8 mg/dL (ref 0.3–1.2)
BUN: 11 mg/dL (ref 6–20)
CALCIUM: 9.3 mg/dL (ref 8.9–10.3)
CO2: 27 mmol/L (ref 22–32)
Chloride: 99 mmol/L — ABNORMAL LOW (ref 101–111)
Creatinine, Ser: 1.18 mg/dL (ref 0.61–1.24)
GFR calc Af Amer: >60 mL/min (ref >60)
GFR calc non Af Amer: >60 mL/min (ref >60)
GLUCOSE: 107 mg/dL — AB (ref 65–99)
Potassium: 3.9 mmol/L (ref 3.5–5.1)
Sodium: 133 mmol/L — ABNORMAL LOW (ref 135–145)
TOTAL PROTEIN: 7.9 g/dL (ref 6.5–8.1)

## 2016-04-02 LAB — CBC WITH DIFFERENTIAL/PLATELET
BASOS ABS: 0 10*3/uL (ref 0.0–0.1)
Basophils Relative: 0 %
Eosinophils Absolute: 0.1 10*3/uL (ref 0.0–0.7)
Eosinophils Relative: 2 %
HEMATOCRIT: 42.4 % (ref 39.0–52.0)
Hemoglobin: 14.1 g/dL (ref 13.0–17.0)
Lymphocytes Relative: 39 %
Lymphs Abs: 2.2 10*3/uL (ref 0.7–4.0)
MCH: 30.2 pg (ref 26.0–34.0)
MCHC: 33.3 g/dL (ref 30.0–36.0)
MCV: 90.8 fL (ref 78.0–100.0)
MONO ABS: 0.5 10*3/uL (ref 0.1–1.0)
Monocytes Relative: 8 %
NEUTROS ABS: 2.9 10*3/uL (ref 1.7–7.7)
Neutrophils Relative %: 51 %
Platelets: 212 10*3/uL (ref 150–400)
RBC: 4.67 MIL/uL (ref 4.22–5.81)
RDW: 12.4 % (ref 11.5–15.5)
WBC: 5.7 10*3/uL (ref 4.0–10.5)

## 2016-04-02 LAB — BASIC METABOLIC PANEL
ANION GAP: 7 (ref 5–15)
BUN: 10 mg/dL (ref 6–20)
CHLORIDE: 101 mmol/L (ref 101–111)
CO2: 28 mmol/L (ref 22–32)
Calcium: 9.3 mg/dL (ref 8.9–10.3)
Creatinine, Ser: 1.12 mg/dL (ref 0.61–1.24)
GFR calc Af Amer: >60 mL/min (ref >60)
GFR calc non Af Amer: >60 mL/min (ref >60)
GLUCOSE: 89 mg/dL (ref 65–99)
POTASSIUM: 4 mmol/L (ref 3.5–5.1)
Sodium: 136 mmol/L (ref 135–145)

## 2016-04-02 LAB — LACTIC ACID, PLASMA: Lactic Acid, Venous: 0.7 mmol/L (ref 0.5–1.9)

## 2016-04-02 LAB — I-STAT CG4 LACTIC ACID, ED: LACTIC ACID, VENOUS: 0.34 mmol/L — AB (ref 0.5–1.9)

## 2016-04-02 MED ORDER — DOXYCYCLINE HYCLATE 100 MG PO CAPS: 100.0000 mg | ORAL_CAPSULE | Freq: Two times a day (BID) | ORAL | 0 refills | Status: DC

## 2016-04-02 MED ORDER — DOXYCYCLINE HYCLATE 100 MG PO TABS
100.0000 mg | ORAL_TABLET | Freq: Once | ORAL | Status: AC
  Administered 2016-04-02: 100 mg via ORAL
  Filled 2016-04-02: qty 1

## 2016-04-02 NOTE — Discharge Instructions (Signed)
Drink plenty of fluids. Take ibuprofen 600 mg + acetaminophen 1000 mg every 6 hrs for fever or body aches. Take the antibiotic until gone.  Recheck if you feel worse.

## 2016-04-02 NOTE — ED Notes (Signed)
Pt called at home with positive blood culture results.  Pt advised to return to ER for further treatment.  Pt verbalized understanding.

## 2016-04-02 NOTE — ED Triage Notes (Signed)
Pt seen here last night and was started on doxycycline.  Pt says feels great today but received a call stating he had positive blood culture and was told to return here.  Pt denies any complaints.

## 2016-04-02 NOTE — ED Provider Notes (Signed)
[facility] Provider Note   CSN: [id number] Arrival date & time: 04/02/16  1347     History   Chief Complaint Chief Complaint  Patient presents with  . positive blood culture    HPI Sean Steele is a 45 y.o. male.  Pt is a very pleasant gentleman who comes in today due to a positive blood culture result.  1 blood culture grew out gram + cocci; pt was told to come back to the ED.  The pt came to this ED last night for a fever and was started on doxycycline.  He said he is feeling much better today, and has no symptoms.      History reviewed. No pertinent past medical history.  Patient Active Problem List   Diagnosis Date Noted  . De Quervain's syndrome (tenosynovitis) 12/13/2010    History reviewed. No pertinent surgical history.     Home Medications    Prior to Admission medications   Medication Sig Start Date End Date Taking? Authorizing Provider  acetaminophen (TYLENOL) 500 MG tablet Take 500 mg by mouth every 6 (six) hours as needed for mild pain or moderate pain.   Yes Historical Provider, MD  doxycycline (VIBRAMYCIN) 100 MG capsule Take 1 capsule (100 mg total) by mouth 2 (two) times daily. 04/02/16   Devoria AlbeIva Knapp, MD    Family History Family History  Problem Relation Age of Onset  . Heart disease    . Diabetes      Social History Social History  Substance Use Topics  . Smoking status: Former Smoker    Types: Cigarettes  . Smokeless tobacco: Never Used  . Alcohol use No     Allergies   Patient has no known allergies.   Review of Systems Review of Systems  All other systems reviewed and are negative.    Physical Exam Updated Vital Signs BP 132/87 (BP Location: Right Arm)   Pulse 78   Temp 98.9 F (37.2 C) (Oral)   Resp 18   Ht 6\' 2"  (1.88 m)   Wt 195 lb (88.5 kg)   SpO2 100%   BMI 25.04 kg/m   Physical Exam  Constitutional: He is oriented to person, place, and time. He appears well-developed and well-nourished.  HENT:   Head: Normocephalic and atraumatic.  Right Ear: External ear normal.  Left Ear: External ear normal.  Nose: Nose normal.  Mouth/Throat: Oropharynx is clear and moist.  Eyes: Conjunctivae and EOM are normal. Pupils are equal, round, and reactive to light.  Neck: Normal range of motion. Neck supple.  Cardiovascular: Normal rate, regular rhythm, normal heart sounds and intact distal pulses.   Pulmonary/Chest: Effort normal and breath sounds normal.  Abdominal: Soft. Bowel sounds are normal.  Musculoskeletal: Normal range of motion.  Neurological: He is alert and oriented to person, place, and time.  Skin: Skin is warm.  Psychiatric: He has a normal mood and affect. His behavior is normal. Judgment and thought content normal.  Nursing note and vitals reviewed.    ED Treatments / Results  Labs (all labs ordered are listed, but only abnormal results are displayed) Labs Reviewed  I-STAT CG4 LACTIC ACID, ED - Abnormal; Notable for the following:       Result Value   Lactic Acid, Venous 0.34 (*)    All other components within normal limits  CULTURE, BLOOD (ROUTINE X 2)  CULTURE, BLOOD (ROUTINE X 2)  CBC WITH DIFFERENTIAL/PLATELET  BASIC METABOLIC PANEL    EKG  EKG Interpretation  None       Radiology Dg Chest 2 View  Result Date: 04/01/2016 CLINICAL DATA:  Cough and diarrhea EXAM: CHEST  2 VIEW COMPARISON:  Chest radiograph 09/01/2014 FINDINGS: Cardiomediastinal contours are normal. No pneumothorax or pleural effusion. No focal airspace consolidation or pulmonary edema. IMPRESSION: Clear lungs. Electronically Signed   By: Deatra RobinsonKevin  Herman M.D.   On: 04/01/2016 00:16   Ct Abdomen Pelvis W Contrast  Result Date: 04/01/2016 CLINICAL DATA:  45 year old male with abdominal pain and diarrhea. EXAM: CT ABDOMEN AND PELVIS WITH CONTRAST TECHNIQUE: Multidetector CT imaging of the abdomen and pelvis was performed using the standard protocol following bolus administration of intravenous  contrast. CONTRAST:  100mL ISOVUE-300 IOPAMIDOL (ISOVUE-300) INJECTION 61% COMPARISON:  None. FINDINGS: Lower chest: The visualized lung bases are clear. No intra-abdominal free air or free fluid. Hepatobiliary: The liver is unremarkable. The gallbladder is contracted. Pancreas: Unremarkable. No pancreatic ductal dilatation or surrounding inflammatory changes. Spleen: Normal in size without focal abnormality. Adrenals/Urinary Tract: Adrenal glands are unremarkable. Kidneys are normal, without renal calculi, focal lesion, or hydronephrosis. Bladder is unremarkable. Stomach/Bowel: Stomach is within normal limits. Appendix appears normal. No evidence of bowel wall thickening, distention, or inflammatory changes. Vascular/Lymphatic: No significant vascular findings are present. No enlarged abdominal or pelvic lymph nodes. Reproductive: The prostate and seminal vesicles are grossly unremarkable. Other: Small fat containing umbilical hernia. Musculoskeletal: No acute or significant osseous findings. IMPRESSION: No acute intra-abdominopelvic pathology. No evidence of bowel obstruction or active inflammation. Normal appendix. Electronically Signed   By: Elgie CollardArash  Radparvar M.D.   On: 04/01/2016 00:29   Dg Chest Port 1 View  Result Date: 04/01/2016 CLINICAL DATA:  45 year old male with fever chills and chest pain. EXAM: PORTABLE CHEST 1 VIEW COMPARISON:  Chest radiograph dated 03/31/2016 FINDINGS: The heart size and mediastinal contours are within normal limits. Both lungs are clear. The visualized skeletal structures are unremarkable. IMPRESSION: No active disease. Electronically Signed   By: Elgie CollardArash  Radparvar M.D.   On: 04/01/2016 23:58    Procedures Procedures (including critical care time)  Medications Ordered in ED Medications - No data to display   Initial Impression / Assessment and Plan / ED Course  I have reviewed the triage vital signs and the nursing notes.  Pertinent labs & imaging results that were  available during my care of the patient were reviewed by me and considered in my medical decision making (see chart for details).  Clinical Course    Pt looks good.  No fevers for nearly 24 hours.  Likely + blood culture is contam.  Pt ok for d/c.  He knows to return if he worsens.  Final Clinical Impressions(s) / ED Diagnoses   Final diagnoses:  Positive blood cultures    New Prescriptions New Prescriptions   No medications on file     Jacalyn LefevreJulie Jaquez Farrington, MD 04/02/16 1846

## 2016-04-03 LAB — URINE CULTURE: CULTURE: NO GROWTH

## 2016-04-04 LAB — CULTURE, BLOOD (ROUTINE X 2)

## 2016-04-05 ENCOUNTER — Telehealth: Payer: Self-pay

## 2016-04-05 NOTE — Telephone Encounter (Signed)
Post ED Visit - Positive Culture Follow-up  Culture report reviewed by antimicrobial stewardship pharmacist:  []  Enzo BiNathan Batchelder, Pharm.D. []  Celedonio MiyamotoJeremy Frens, Pharm.D., BCPS [x]  Garvin FilaMike Maccia, Pharm.D. []  Georgina PillionElizabeth Martin, Pharm.D., BCPS []  ArdencroftMinh Pham, [address]Ph[address]D., BCPS, AAHIVP []  Estella HuskMichelle Turner, Pharm.D., BCPS, AAHIVP []  Tennis Mustassie Stewart, [address]Pharm[address] []  Sherle Poeob Vincent, [address]Ph[address]  Positive blood  culture Treated with doxycycline, organism sensitive to the same and no further patient follow-up is required at this time.  Jerry CarasCullom, Alivya Wegman Burnett 04/05/2016, 9:01 AM

## 2016-04-06 LAB — CULTURE, BLOOD (ROUTINE X 2): CULTURE: NO GROWTH

## 2016-04-07 LAB — CULTURE, BLOOD (ROUTINE X 2)
CULTURE: NO GROWTH
Culture: NO GROWTH

## 2016-04-28 IMAGING — DX DG CHEST 2V
2 series · 2 of 2 positions shown · non-contrast
Comparison: None.

CLINICAL DATA: Fever. Chills. Body Aches. Onset of symptoms at 430
this morning.

EXAM:
CHEST  2 VIEW

[chest pa]
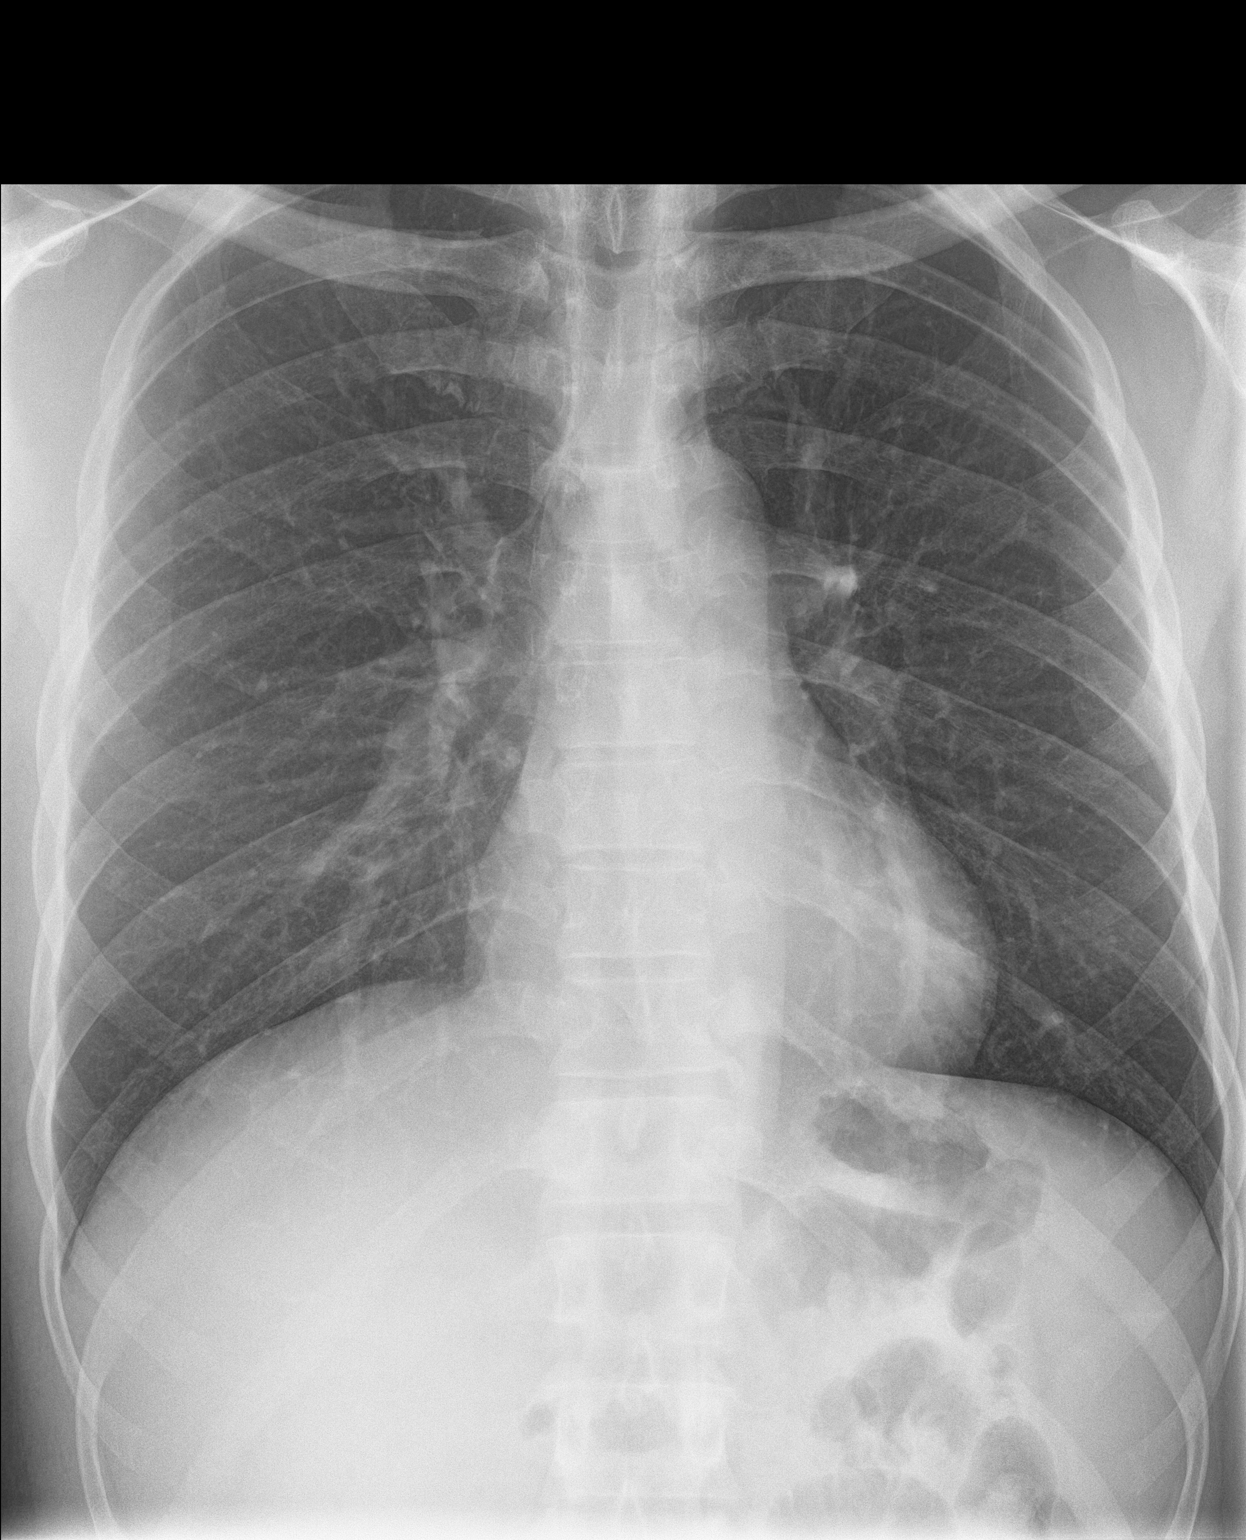

[chest lat]
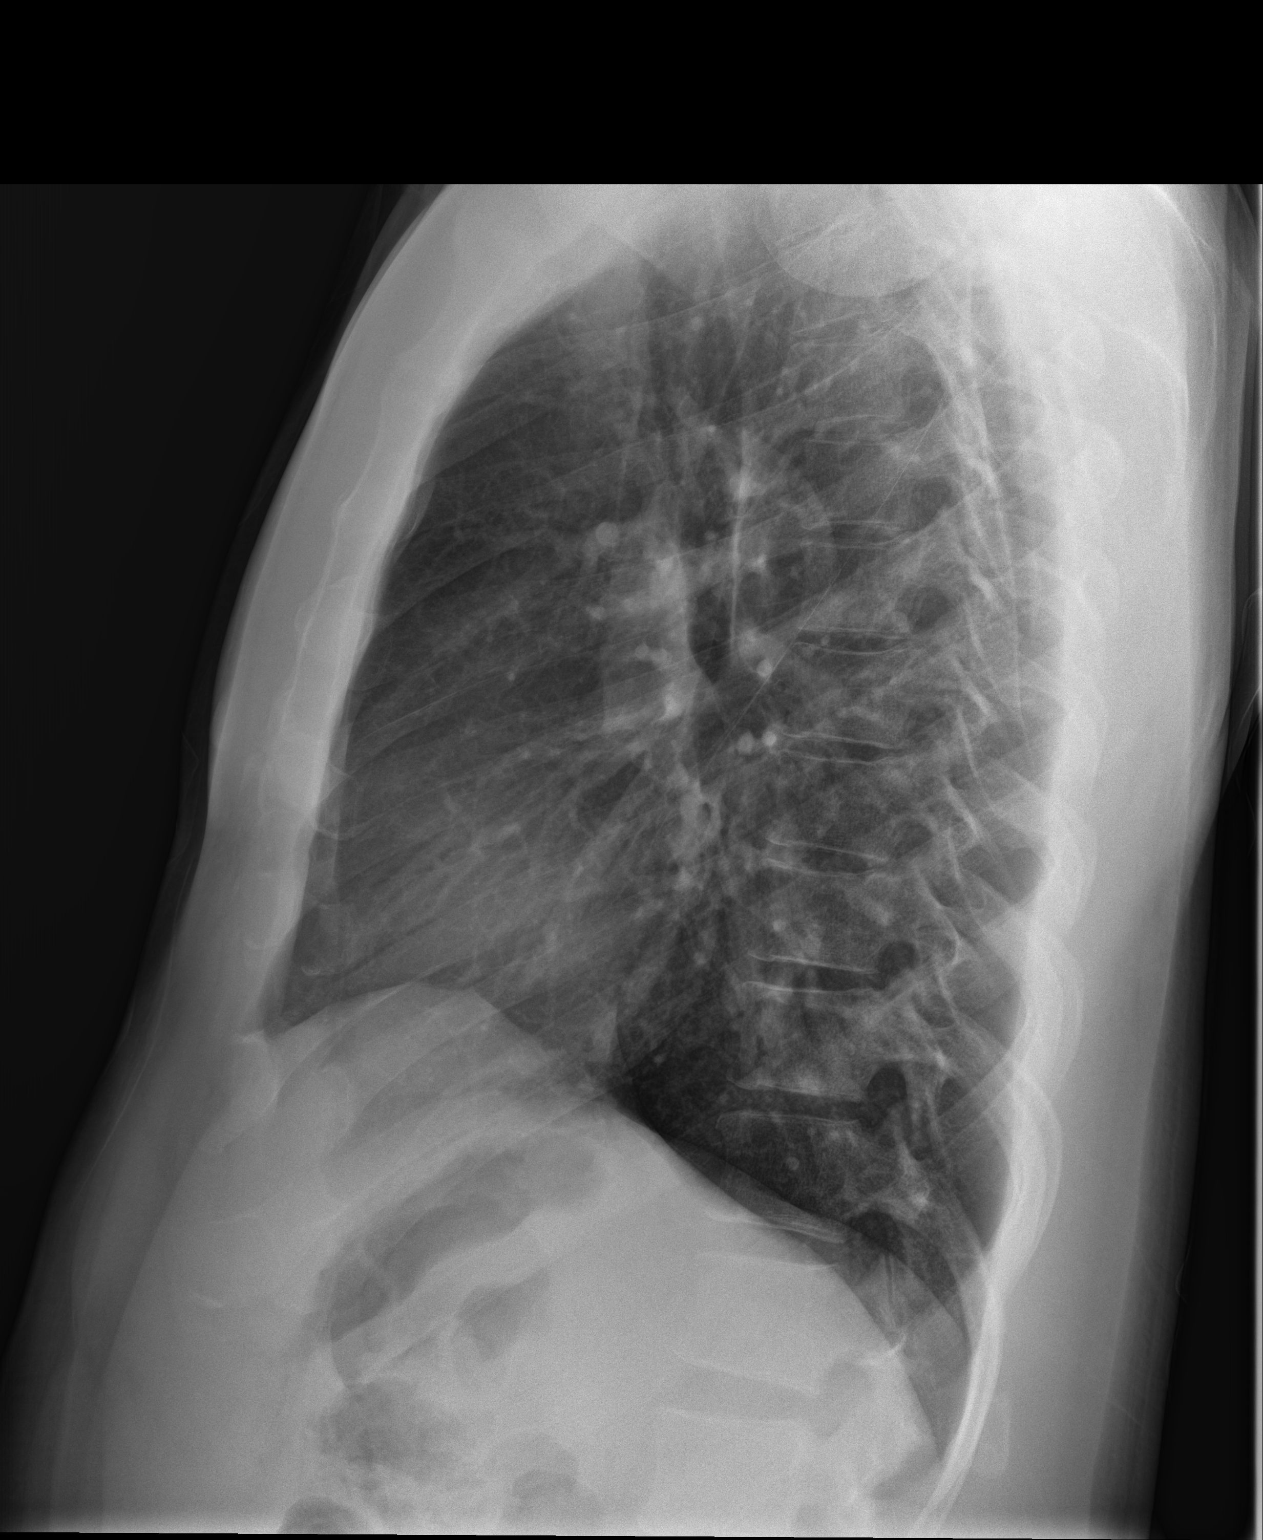

[2 of 2 positions shown; findings below may reference images not displayed]

FINDINGS: Cardiopericardial silhouette within normal limits. Mediastinal
contours normal. Trachea midline. No airspace disease or effusion.
IMPRESSION: No active cardiopulmonary disease.

## 2016-09-27 ENCOUNTER — Emergency Department (HOSPITAL_COMMUNITY): Payer: Self-pay

## 2016-09-27 ENCOUNTER — Emergency Department (HOSPITAL_COMMUNITY)
Admission: EM | Admit: 2016-09-27 | Discharge: 2016-09-28 | Disposition: A | Discharge status: Home / Self Care | Payer: Self-pay | Attending: Emergency Medicine | Admitting: Emergency Medicine

## 2016-09-27 ENCOUNTER — Encounter (HOSPITAL_COMMUNITY): Payer: Self-pay | Admitting: *Deleted

## 2016-09-27 DIAGNOSIS — Y999 Unspecified external cause status: Secondary | ICD-10-CM | POA: Insufficient documentation

## 2016-09-27 DIAGNOSIS — Y939 Activity, unspecified: Secondary | ICD-10-CM | POA: Insufficient documentation

## 2016-09-27 DIAGNOSIS — Z87891 Personal history of nicotine dependence: Secondary | ICD-10-CM | POA: Insufficient documentation

## 2016-09-27 DIAGNOSIS — Y929 Unspecified place or not applicable: Secondary | ICD-10-CM | POA: Insufficient documentation

## 2016-09-27 DIAGNOSIS — S92425A Nondisplaced fracture of distal phalanx of left great toe, initial encounter for closed fracture: Secondary | ICD-10-CM | POA: Insufficient documentation

## 2016-09-27 DIAGNOSIS — W228XXA Striking against or struck by other objects, initial encounter: Secondary | ICD-10-CM | POA: Insufficient documentation

## 2016-09-27 MED ORDER — IBUPROFEN 800 MG PO TABS
800.0000 mg | ORAL_TABLET | Freq: Once | ORAL | Status: AC
  Administered 2016-09-27: 800 mg via ORAL
  Filled 2016-09-27: qty 1

## 2016-09-27 MED ORDER — HYDROCODONE-ACETAMINOPHEN 5-325 MG PO TABS
1.0000 | ORAL_TABLET | Freq: Once | ORAL | Status: AC
  Administered 2016-09-27: 1 via ORAL
  Filled 2016-09-27: qty 1

## 2016-09-27 NOTE — ED Triage Notes (Signed)
Pt c/o pain to left great toe; pt states his toe struck a banister railing by accident this morning; toe is swollen and purple with limited ROM

## 2016-09-28 MED ORDER — HYDROCODONE-ACETAMINOPHEN 5-325 MG PO TABS: ORAL_TABLET | ORAL | 0 refills | Status: AC

## 2016-09-28 MED ORDER — IBUPROFEN 800 MG PO TABS: 800.0000 mg | ORAL_TABLET | Freq: Three times a day (TID) | ORAL | 0 refills | Status: AC

## 2016-09-28 NOTE — ED Provider Notes (Signed)
[facility]-EMER[facility]ENCY DEPT Provider Note   CSN: [id number] Arrival date & time: 09/27/16  2318     History   Chief Complaint Chief Complaint  Patient presents with  . Toe Injury    HPI Sean Steele is a 46 y.o. male.  HPI   Sean Steele is a 46 y.o. male who presents to the Emergency Department complaining of left great toe pain, bruising and swelling.  He states that he struck his toe on a banister railing on the morning of ER arrival.  He describes increasing pain and throbbing sensation to the toe and pain that worsens with weight bearing.  He has applied ice earlier.  He denies open wounds, numbness, and pain to the proximal foot or ankle.  Also denies injury to the nail.     History reviewed. No pertinent past medical history.  Patient Active Problem List   Diagnosis Date Noted  . De Quervain's syndrome (tenosynovitis) 12/13/2010    History reviewed. No pertinent surgical history.     Home Medications    Prior to Admission medications   Medication Sig Start Date End Date Taking? Authorizing Provider  acetaminophen (TYLENOL) 500 MG tablet Take 500 mg by mouth every 6 (six) hours as needed for mild pain or moderate pain.    [provider]  HYDROcodone-acetaminophen (NORCO/VICODIN) 5-325 MG tablet Take one tab po q 4-6 hrs prn pain 09/28/16   Emitt Maglione, PA-C  ibuprofen (ADVIL,MOTRIN) 800 MG tablet Take 1 tablet (800 mg total) by mouth 3 (three) times daily. 09/28/16   Pauline Aus, PA-C    Family History Family History  Problem Relation Age of Onset  . Heart disease Unknown   . Diabetes Unknown     Social History Social History  Substance Use Topics  . Smoking status: Former Smoker    Types: Cigarettes  . Smokeless tobacco: Never Used  . Alcohol use No     Allergies   Patient has no known allergies.   Review of Systems Review of Systems  Constitutional: Negative for chills and fever.  Genitourinary: Negative for difficulty  urinating and dysuria.  Musculoskeletal: Positive for arthralgias (left great toe pain) and joint swelling.  Skin: Positive for color change (bruising ot the great toe). Negative for wound.  Neurological: Negative for weakness and numbness.  All other systems reviewed and are negative.    Physical Exam Updated Vital Signs BP 132/88 (BP Location: Left Arm)   Pulse 85   Temp 98 F (36.7 C) (Oral)   Resp 16   Ht 6\' 2"  (1.88 m)   Wt 83.9 kg (185 lb)   SpO2 98%   BMI 23.75 kg/m   Physical Exam  Constitutional: He is oriented to person, place, and time. He appears well-nourished. No distress.  HENT:  Head: Atraumatic.  Cardiovascular: Normal rate, regular rhythm and intact distal pulses.   Pulmonary/Chest: Effort normal and breath sounds normal.  Musculoskeletal: He exhibits edema and tenderness.  Diffuse edema and ecchymosis of the left great toe.  No bony deformity.  Nail appears intact. No subungual hematoma.    Neurological: He is alert and oriented to person, place, and time. No sensory deficit.  Skin: Skin is warm. Capillary refill takes less than 2 seconds.  Nursing note and vitals reviewed.    ED Treatments / Results  Labs (all labs ordered are listed, but only abnormal results are displayed) Labs Reviewed - No data to display  EKG  EKG Interpretation None  Radiology Dg Toe Great Left  Result Date: 09/28/2016 CLINICAL DATA:  Left great toe injury. EXAM: LEFT GREAT TOE COMPARISON:  None. FINDINGS: There is a nondisplaced, intra-articular fracture of the left first distal phalanx. There is associated soft tissue swelling. No other fracture identified. IMPRESSION: Nondisplaced, intra-articular fracture of the base of the left first distal phalanx. Electronically Signed   By: Deatra RobinsonKevin  Herman M.D.   On: 09/28/2016 00:11    Procedures Procedures (including critical care time)  Medications Ordered in ED Medications  HYDROcodone-acetaminophen (NORCO/VICODIN)  5-325 MG per tablet 1 tablet (1 tablet Oral Given 09/27/16 2356)  ibuprofen (ADVIL,MOTRIN) tablet 800 mg (800 mg Oral Given 09/27/16 2356)     Initial Impression / Assessment and Plan / ED Course  I have reviewed the triage vital signs and the nursing notes.  Pertinent labs & imaging results that were available during my care of the patient were reviewed by me and considered in my medical decision making (see chart for details).     XR results discussed.  Remains NV intact.  No open wounds or injury of the nail.    Toes buddy taped, post op shoe applied by nursing.  .  Pain improving.  Pt agrees to elevate, ice, close f/u with orthopedics.    Final Clinical Impressions(s) / ED Diagnoses   Final diagnoses:  Nondisplaced fracture of distal phalanx of left great toe, initial encounter for closed fracture    New Prescriptions Discharge Medication List as of 09/28/2016 12:46 AM    START taking these medications   Details  HYDROcodone-acetaminophen (NORCO/VICODIN) 5-325 MG tablet Take one tab po q 4-6 hrs prn pain, Print    ibuprofen (ADVIL,MOTRIN) 800 MG tablet Take 1 tablet (800 mg total) by mouth 3 (three) times daily., Starting Sat 09/28/2016, Print         Fairwaterriplett, Wellsammy, PA-C 09/28/16 [id number]    Devoria AlbeKnapp, Iva, MD 09/28/16 [phone]

## 2016-09-28 NOTE — Discharge Instructions (Signed)
Elevate and apply ice packs on/off to your foot.  Keep the toe buddy taped and wear the post op shoe.  Call Dr. Mort SawyersHArrison's office on Monday to arrange a follow-up

## 2016-10-07 ENCOUNTER — Ambulatory Visit: Payer: Self-pay | Admitting: Orthopaedic Surgery

## 2016-10-08 ENCOUNTER — Ambulatory Visit: Payer: Self-pay | Admitting: Orthopaedic Surgery

## 2016-10-09 ENCOUNTER — Encounter: Payer: Self-pay | Admitting: Orthopaedic Surgery

## 2016-10-09 ENCOUNTER — Ambulatory Visit (INDEPENDENT_AMBULATORY_CARE_PROVIDER_SITE_OTHER): Payer: Self-pay | Admitting: Orthopaedic Surgery

## 2016-10-09 VITALS — BP 127/72 | HR 69 | Temp 97.5°F | Ht 75.0 in | Wt 201.0 lb

## 2016-10-09 DIAGNOSIS — S92425A Nondisplaced fracture of distal phalanx of left great toe, initial encounter for closed fracture: Secondary | ICD-10-CM

## 2016-10-09 NOTE — Progress Notes (Signed)
Subjective:    Patient ID: Sean Steele, male    DOB: 1971-03-29, 46 y.o.   MRN: [id number]  HPI He hit his left great toe on the railing of his deck on 09-28-16.  He was seen in the ER.  X-rays showed fracture of the distal phalanx of the great toe left. He had no other injury.  He has been wearing a sandal.  He has just slight pain.   Review of Systems  HENT: Negative for congestion.   Cardiovascular: Negative for chest pain and leg swelling.  Endocrine: Negative for cold intolerance.  Musculoskeletal: Positive for arthralgias and gait problem.  Allergic/Immunologic: Negative for environmental allergies.   No past medical history on file.  No past surgical history on file.  Current Outpatient Prescriptions on File Prior to Visit  Medication Sig Dispense Refill  . acetaminophen (TYLENOL) 500 MG tablet Take 500 mg by mouth every 6 (six) hours as needed for mild pain or moderate pain.    Marland Kitchen HYDROcodone-acetaminophen (NORCO/VICODIN) 5-325 MG tablet Take one tab po q 4-6 hrs prn pain 15 tablet 0  . ibuprofen (ADVIL,MOTRIN) 800 MG tablet Take 1 tablet (800 mg total) by mouth 3 (three) times daily. 21 tablet 0   No current facility-administered medications on file prior to visit.     Social History   Social History  . Marital status: Married    Spouse name: N/A  . Number of children: N/A  . Years of education: N/A   Occupational History  . Not on file.   Social History Main Topics  . Smoking status: Former Smoker    Types: Cigarettes  . Smokeless tobacco: Never Used  . Alcohol use No  . Drug use: Yes    Types: Marijuana     Comment: yesterday  . Sexual activity: Not on file   Other Topics Concern  . Not on file   Social History Narrative  . No narrative on file    Family History  Problem Relation Age of Onset  . Heart disease Unknown   . Diabetes Unknown     BP 127/72   Pulse 69   Temp 97.5 F (36.4 C)   Ht 6\' 3"  (1.905 m)   Wt 201 lb (91.2 kg)   BMI  25.12 kg/m      Objective:   Physical Exam  Constitutional: He is oriented to person, place, and time. He appears well-developed and well-nourished.  HENT:  Head: Normocephalic and atraumatic.  Eyes: Conjunctivae and EOM are normal. Pupils are equal, round, and reactive to light.  Neck: Normal range of motion. Neck supple.  Cardiovascular: Normal rate, regular rhythm and intact distal pulses.   Pulmonary/Chest: Effort normal.  Abdominal: Soft.  Musculoskeletal: He exhibits tenderness (Left great toe with some tenderness and swelling.  NV intact. ROM decreased.  Limp to the left.  Right foot negativel).  Neurological: He is alert and oriented to person, place, and time. He has normal reflexes. He displays normal reflexes. No cranial nerve deficit. He exhibits normal muscle tone. Coordination normal.  Skin: Skin is warm and dry.  Psychiatric: He has a normal mood and affect. His behavior is normal. Judgment and thought content normal.  Vitals reviewed.         Assessment & Plan:   Encounter Diagnosis  Name Primary?  . Closed nondisplaced fracture of distal phalanx of left great toe, initial encounter Yes   Buddy tape great toe to second toe.  Return in two  weeks.  X-rays on return.  Call if any problem.  Precautions discussed.    Electronically Signed Darreld McleanWayne Kwamaine Cuppett, MD 6/27/20189:34 AM

## 2016-10-24 ENCOUNTER — Encounter: Payer: Self-pay | Admitting: Orthopaedic Surgery

## 2016-10-24 ENCOUNTER — Other Ambulatory Visit: Payer: Self-pay

## 2016-10-31 NOTE — Progress Notes (Signed)
This encounter was created in error - please disregard.

## 2017-11-26 IMAGING — DX DG CHEST 2V
2 series · 2 of 2 positions shown · non-contrast
Comparison: Chest radiograph 09/01/2014

CLINICAL DATA: Cough and diarrhea

EXAM:
CHEST  2 VIEW

[chest pa]
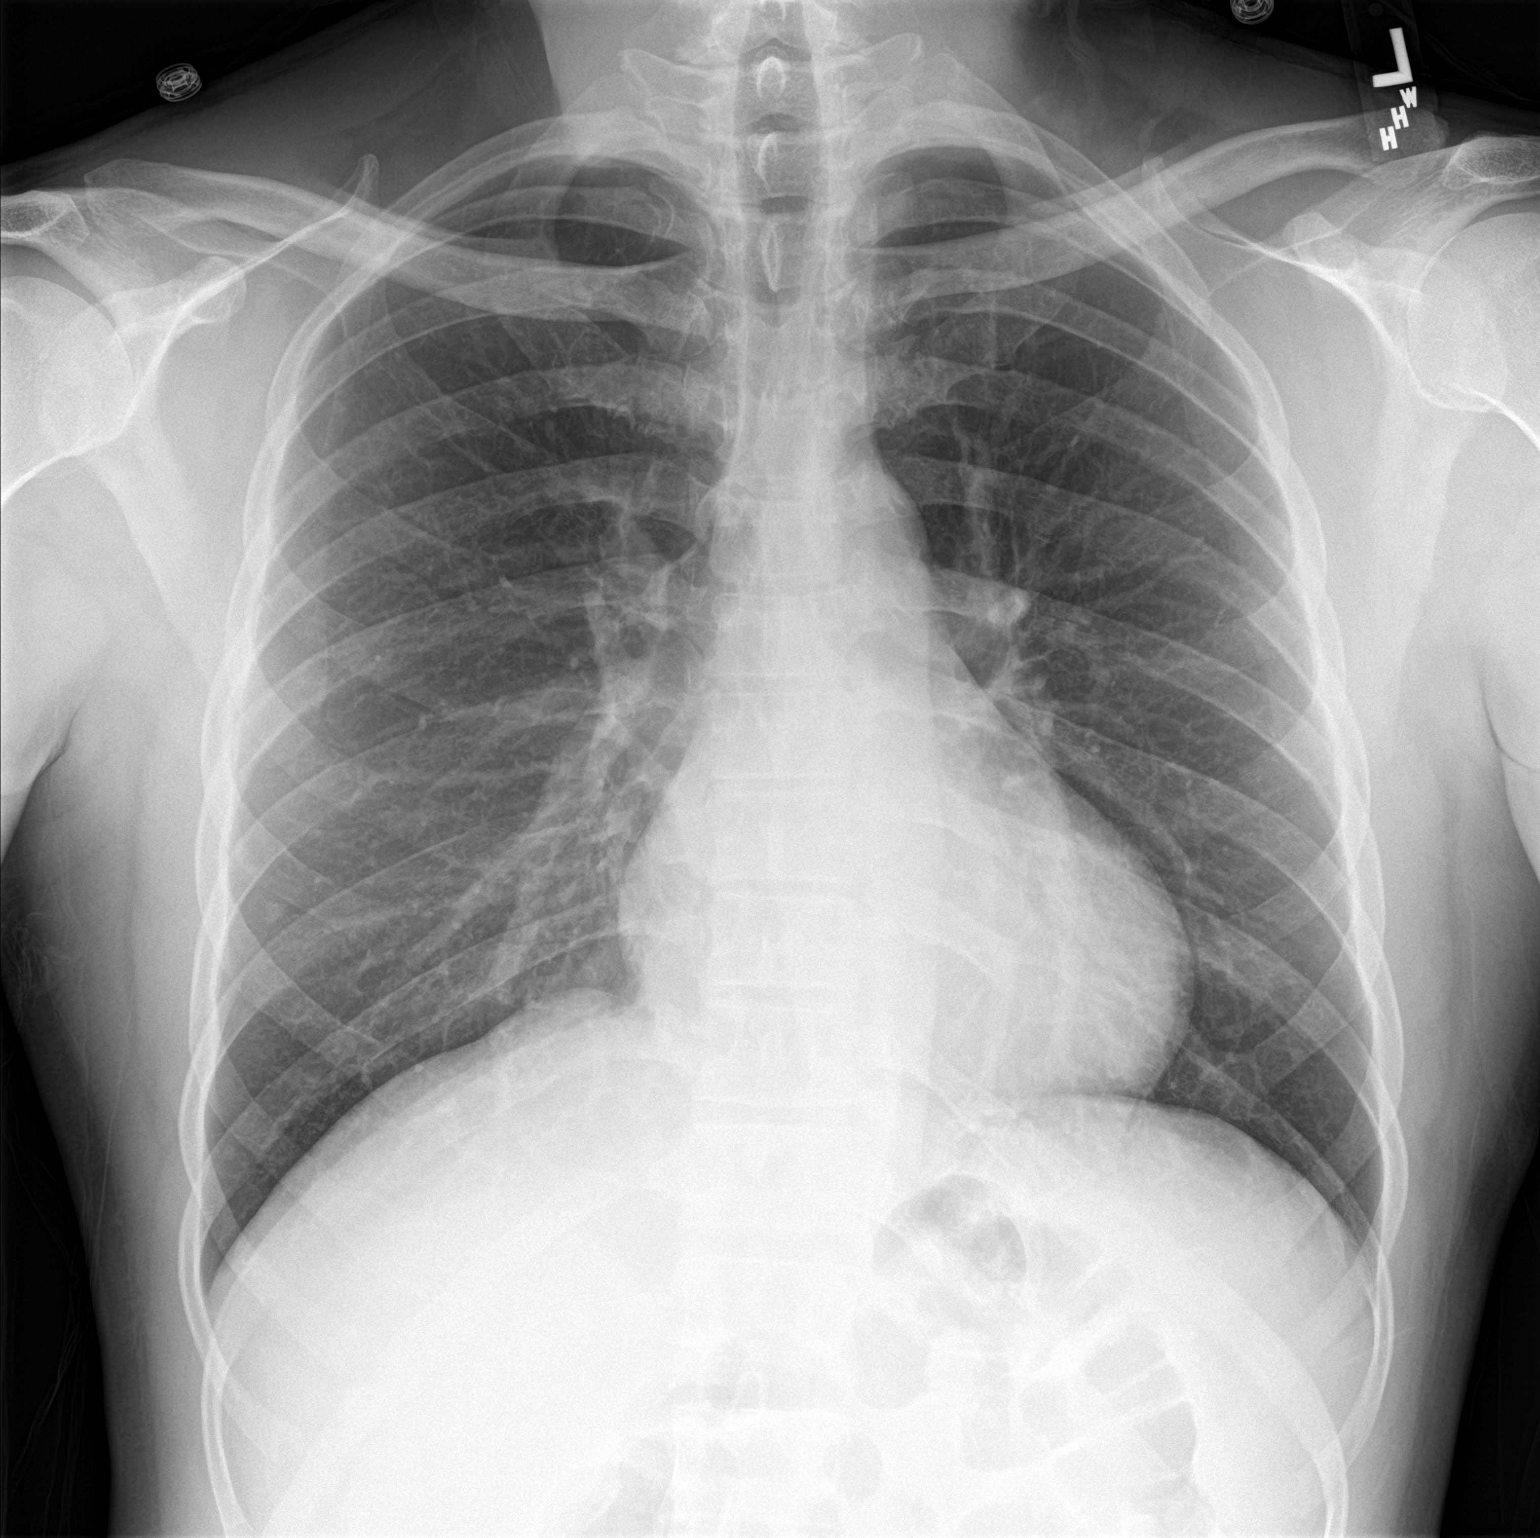

[chest lat]
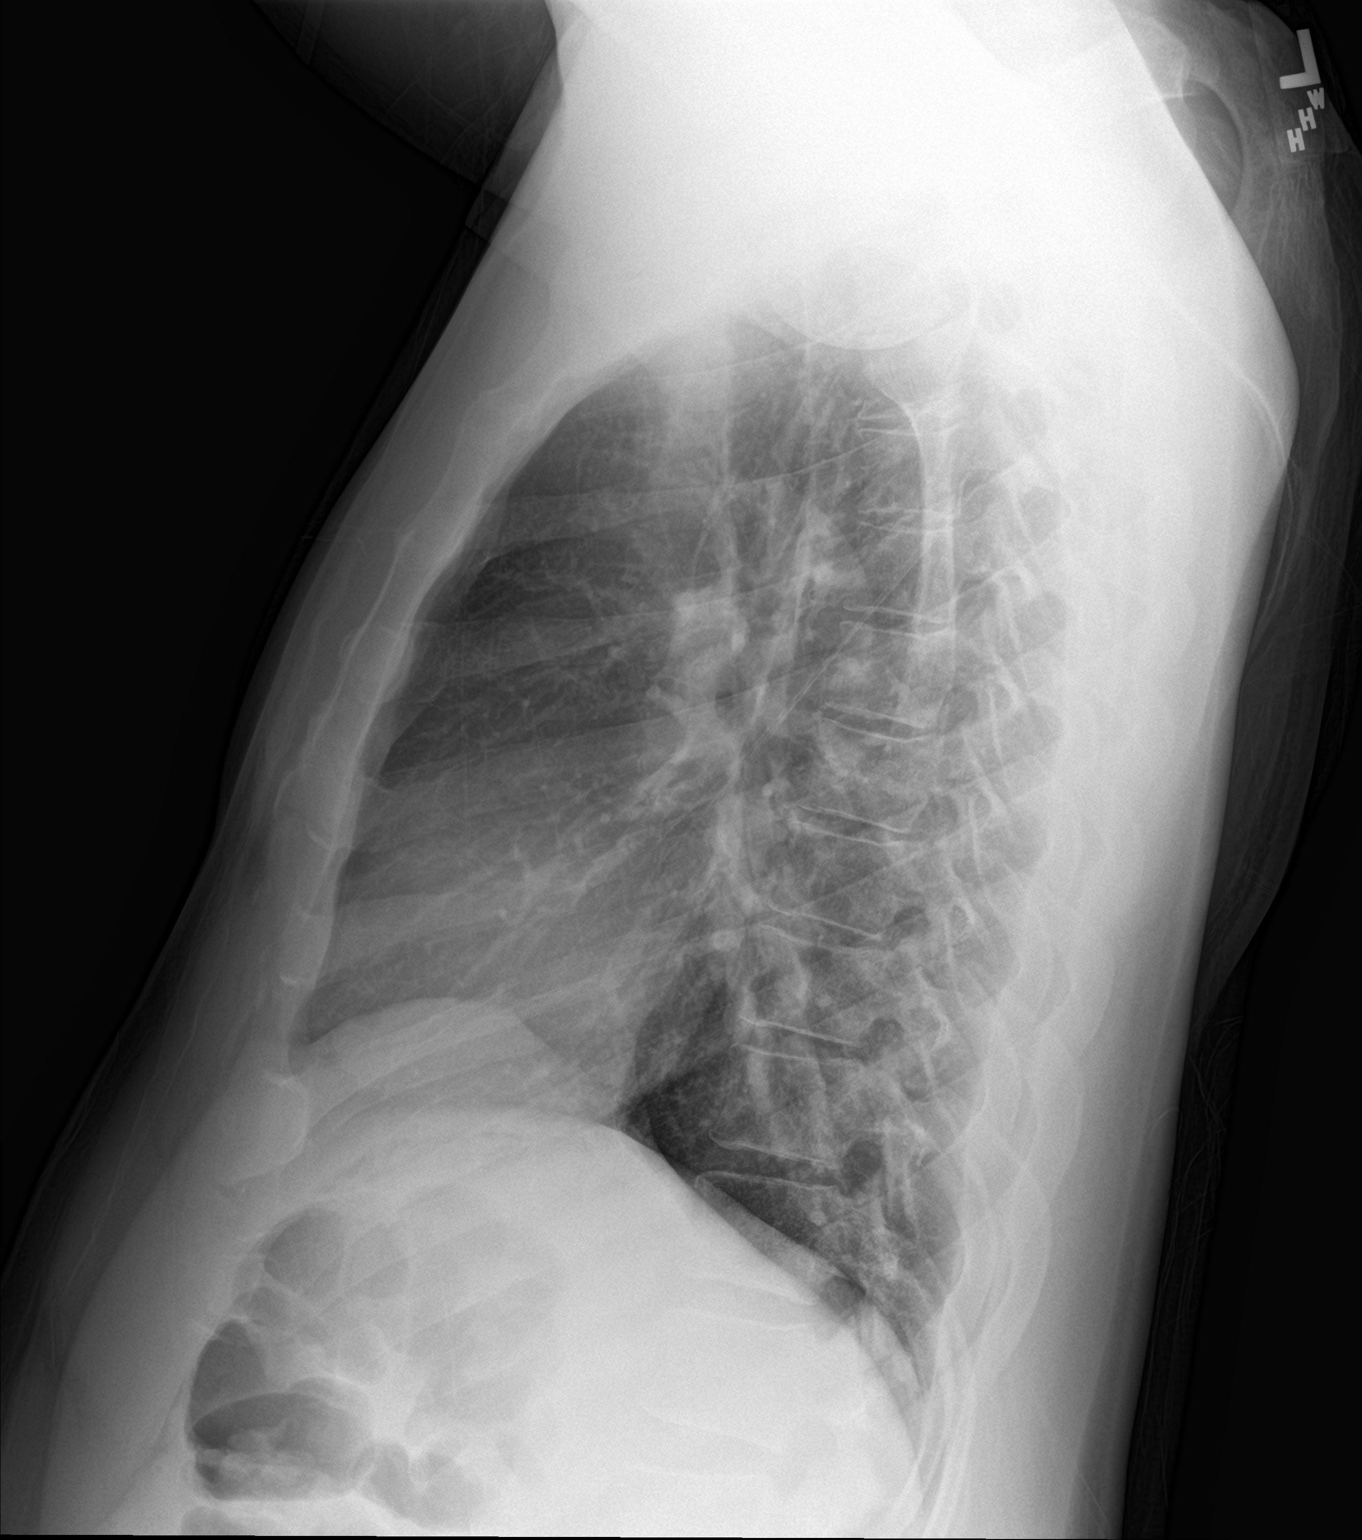

[2 of 2 positions shown; findings below may reference images not displayed]

FINDINGS: Cardiomediastinal contours are normal. No pneumothorax or pleural
effusion.

No focal airspace consolidation or pulmonary edema.
IMPRESSION: Clear lungs.

## 2017-11-27 IMAGING — CR DG CHEST 1V PORT
1 series · 1 of 1 positions shown · non-contrast
Comparison: Chest radiograph dated 03/31/2016

CLINICAL DATA: 45-year-old male with fever chills and chest pain.

EXAM:
PORTABLE CHEST 1 VIEW

[ap]
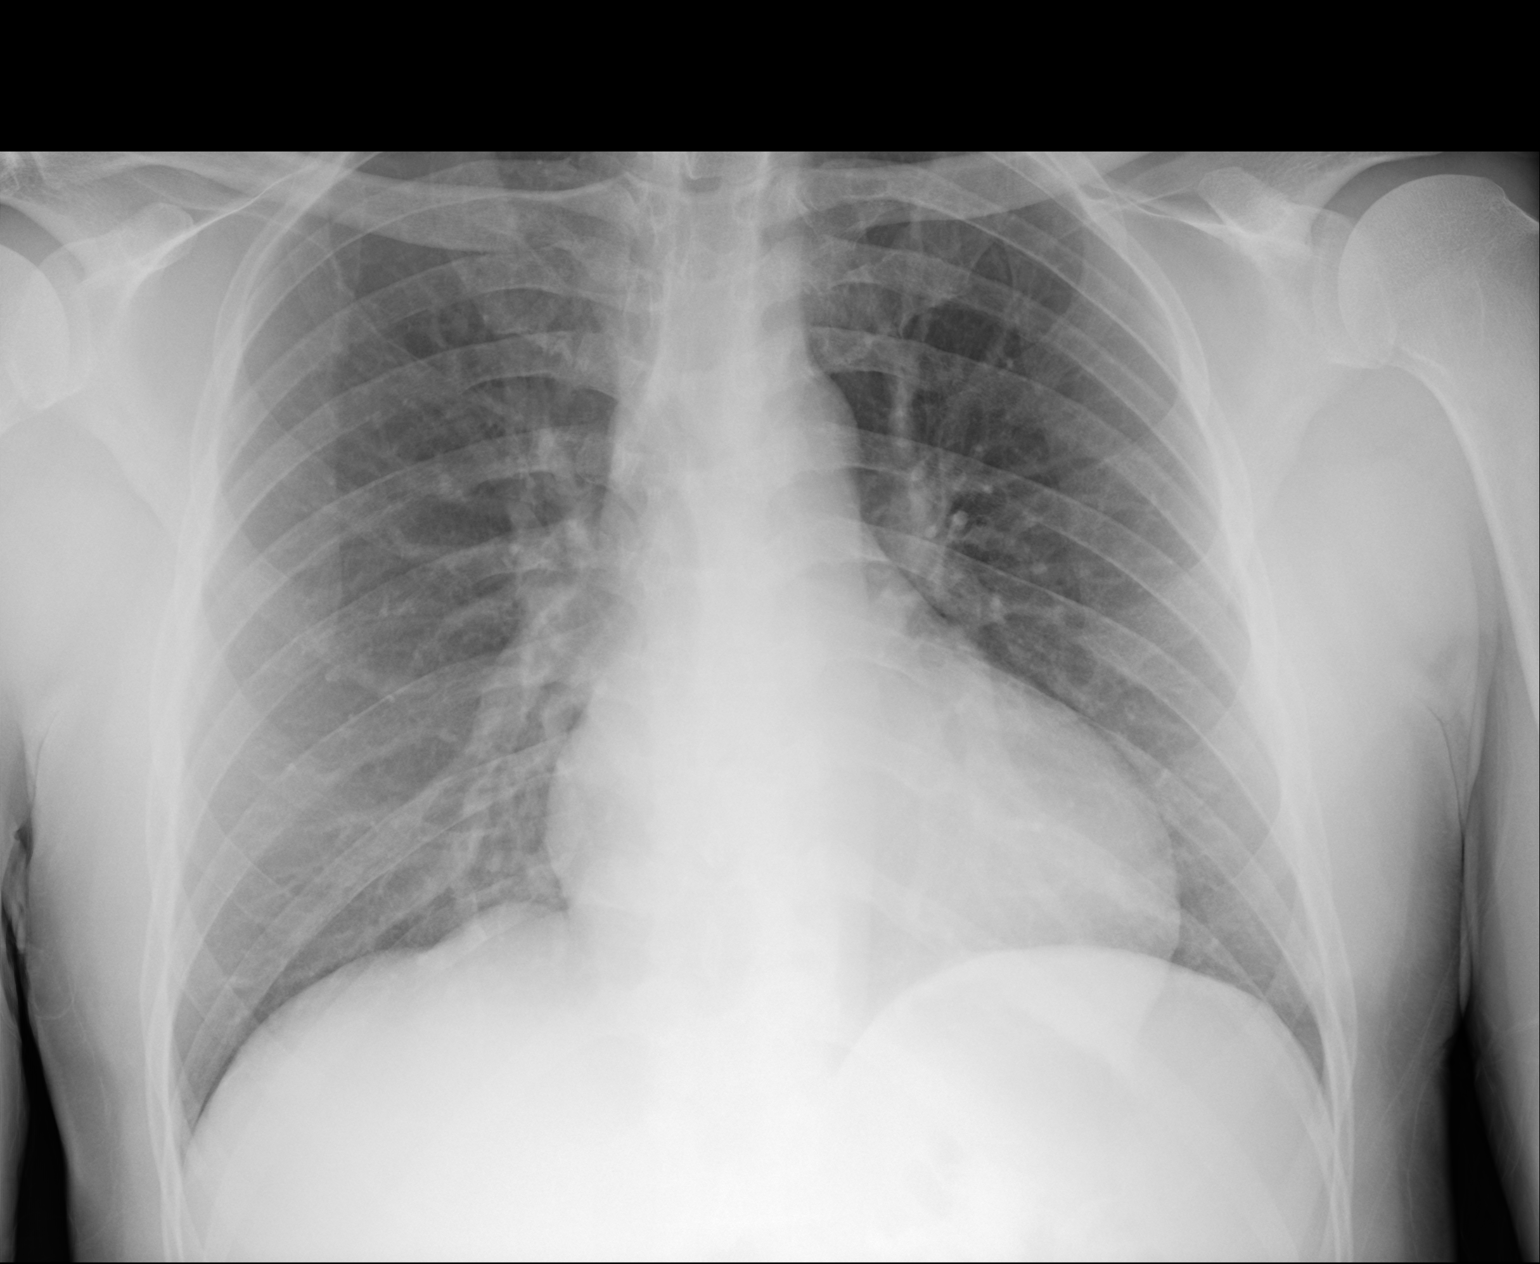

[1 of 1 positions shown; findings below may reference images not displayed]

FINDINGS: The heart size and mediastinal contours are within normal limits.
Both lungs are clear. The visualized skeletal structures are
unremarkable.
IMPRESSION: No active disease.

## 2021-07-05 ENCOUNTER — Encounter: Payer: Self-pay | Admitting: Internal Medicine

## 2021-07-30 ENCOUNTER — Ambulatory Visit: Payer: Self-pay | Admitting: Gastroenterology

## 2021-08-29 ENCOUNTER — Ambulatory Visit: Payer: Self-pay | Admitting: Gastroenterology

## 2021-08-29 ENCOUNTER — Encounter: Payer: Self-pay | Admitting: Internal Medicine
